# Patient Record
Sex: Female | Born: 1985 | Race: White | Hispanic: No | Marital: Married | Smoking: Former smoker
Health system: Southern US, Community
[De-identification: ages and names within clinical notes are randomized; demographics above are authoritative.]

## PROBLEM LIST (undated history)

## (undated) DIAGNOSIS — K589 Irritable bowel syndrome without diarrhea: Secondary | ICD-10-CM

## (undated) DIAGNOSIS — IMO0002 Reserved for concepts with insufficient information to code with codable children: Secondary | ICD-10-CM

## (undated) DIAGNOSIS — R87619 Unspecified abnormal cytological findings in specimens from cervix uteri: Secondary | ICD-10-CM

## (undated) DIAGNOSIS — F32A Depression, unspecified: Secondary | ICD-10-CM

## (undated) DIAGNOSIS — F329 Major depressive disorder, single episode, unspecified: Secondary | ICD-10-CM

## (undated) DIAGNOSIS — T7840XA Allergy, unspecified, initial encounter: Secondary | ICD-10-CM

## (undated) DIAGNOSIS — F419 Anxiety disorder, unspecified: Secondary | ICD-10-CM

## (undated) DIAGNOSIS — E162 Hypoglycemia, unspecified: Secondary | ICD-10-CM

## (undated) HISTORY — PX: INDUCED ABORTION: SHX677

## (undated) HISTORY — DX: Irritable bowel syndrome, unspecified: K58.9

## (undated) HISTORY — DX: Major depressive disorder, single episode, unspecified: F32.9

## (undated) HISTORY — DX: Allergy, unspecified, initial encounter: T78.40XA

## (undated) HISTORY — DX: Depression, unspecified: F32.A

---

## 2008-07-10 LAB — CONVERTED CEMR LAB

## 2010-05-01 HISTORY — PX: EYE SURGERY: SHX253

## 2010-05-19 ENCOUNTER — Ambulatory Visit
Admission: RE | Admit: 2010-05-19 | Discharge: 2010-05-19 | Payer: Self-pay | Source: Home / Self Care | Attending: Family Medicine | Admitting: Family Medicine

## 2010-05-19 ENCOUNTER — Other Ambulatory Visit: Payer: Self-pay | Admitting: Family Medicine

## 2010-05-19 ENCOUNTER — Encounter: Payer: Self-pay | Admitting: Family Medicine

## 2010-05-19 DIAGNOSIS — F329 Major depressive disorder, single episode, unspecified: Secondary | ICD-10-CM | POA: Insufficient documentation

## 2010-05-19 DIAGNOSIS — J45909 Unspecified asthma, uncomplicated: Secondary | ICD-10-CM | POA: Insufficient documentation

## 2010-05-19 DIAGNOSIS — J309 Allergic rhinitis, unspecified: Secondary | ICD-10-CM | POA: Insufficient documentation

## 2010-05-19 DIAGNOSIS — R5383 Other fatigue: Secondary | ICD-10-CM

## 2010-05-19 DIAGNOSIS — R109 Unspecified abdominal pain: Secondary | ICD-10-CM | POA: Insufficient documentation

## 2010-05-19 DIAGNOSIS — R5381 Other malaise: Secondary | ICD-10-CM | POA: Insufficient documentation

## 2010-05-19 LAB — CBC WITH DIFFERENTIAL/PLATELET
Basophils Absolute: 0 10*3/uL (ref 0.0–0.1)
HCT: 38.4 % (ref 36.0–46.0)
Lymphs Abs: 1.7 10*3/uL (ref 0.7–4.0)
MCV: 89.3 fl (ref 78.0–100.0)
Monocytes Absolute: 0.8 10*3/uL (ref 0.1–1.0)
Monocytes Relative: 10.8 % (ref 3.0–12.0)
Neutrophils Relative %: 64.4 % (ref 43.0–77.0)
Platelets: 288 10*3/uL (ref 150.0–400.0)
RDW: 12.4 % (ref 11.5–14.6)

## 2010-05-19 LAB — BASIC METABOLIC PANEL
BUN: 17 mg/dL (ref 6–23)
Creatinine, Ser: 0.7 mg/dL (ref 0.4–1.2)
GFR: 107.19 mL/min (ref >60.00)
Glucose, Bld: 89 mg/dL (ref 70–99)
Potassium: 4.4 mEq/L (ref 3.5–5.1)

## 2010-05-19 LAB — HEPATIC FUNCTION PANEL
Albumin: 3.5 g/dL (ref 3.5–5.2)
Total Bilirubin: 0.6 mg/dL (ref 0.3–1.2)

## 2010-05-19 LAB — LIPID PANEL
Cholesterol: 198 mg/dL (ref 0–200)
Triglycerides: 100 mg/dL (ref 0.0–149.0)
VLDL: 20 mg/dL (ref 0.0–40.0)

## 2010-05-19 LAB — TSH: TSH: 1.74 u[IU]/mL (ref 0.35–5.50)

## 2010-05-21 ENCOUNTER — Encounter
Admission: RE | Admit: 2010-05-21 | Discharge: 2010-05-21 | Payer: Self-pay | Source: Home / Self Care | Attending: Family Medicine | Admitting: Family Medicine

## 2010-05-28 NOTE — Assessment & Plan Note (Signed)
Summary: NEW PT TO EST/STOMACH/REFILL MEDS/   Vital Signs:  Patient profile:   25 year old female Height:      65 inches Weight:      137 pounds BMI:     22.88 Temp:     97.6 degrees F oral Pulse rate:   83 / minute Pulse rhythm:   regular BP sitting:   110 / 72  (left arm) Cuff size:   regular  Vitals Entered By: Linde Gillis CMA [facility] Dull) (May 19, 2010 10:39 AM) CC: new patient, establish care   History of Present Illness: 25 yo new to our practice with:  1. abdominal pain- on and off for several months.  thought it was a lactose intolerance because she would have bloating, generalized abdominal pain, nausea and diarrhea after eating cheese.  Lactaid has helped a little but symptoms still occuring several times per week and seem to be more severe in nature.  has had some episodes of vomiting and RUQ pain as well. no fevers or chills. no blood or mucous in stool. no fh of ibd.  2.  adjustment disorder with depressive mood- followed by Wendall Mola (psych).  On celexa 10 mg daily since her father died last year.  less tearful, more energy but still fatigued. less hypersomnia.   Current Medications (verified): 1)  Nuvaring 0.12-0.015 Mg/24hr Ring (Etonogestrel-Ethinyl Estradiol) .... Use As Directed 2)  Xyzal 5 Mg Tabs (Levocetirizine Dihydrochloride) .... Take One Tablet By Mouth At Bedtime 3)  Celexa 10 Mg Tabs (Citalopram Hydrobromide) .... Take One Tablet By Mouth At Bedtime 4)  Singulair 10 Mg Tabs (Montelukast Sodium) .... Take One Tablet By Mouth At Bedtime 5)  Nasonex 50 Mcg/act Susp (Mometasone Furoate) .... Use Daily 6)  Bepreve 1.5 % Soln (Bepotastine Besilate) .... Use Daily 7)  Xanax 0.25 Mg Tabs (Alprazolam) .... Take As Needed For Anxiety 8)  Bentyl 20 Mg Tabs (Dicyclomine Hcl) .Marland Kitchen.. 1 Tab By Mouth Four Times Daily As Needed For Abominal Discomfort 9)  One Touch Test Strp (Glucose Blood) .... Use As Directed. 10)  Onetouch Basic System W/device Kit (Blood Glucose  Monitoring Suppl) .... Use Ase Directed  Allergies (verified): No Known Drug Allergies  Past History:  Family History: Last updated: 05/19/2010 Family History of CAD Female 1st degree relative <50 Family History Thyroid disease Family History of Thromboembolism clotting disorder Family History of Stroke M 1st degree relative <50  Social History: Last updated: 05/19/2010 Married to Domingo Madeira 71 year old son medical case manager  Past Medical History: Depression Allergic rhinitis Asthma G1P1  Past Surgical History: lasik 05/01/10  Family History: Family History of CAD Female 1st degree relative <50 Family History Thyroid disease Family History of Thromboembolism clotting disorder Family History of Stroke M 1st degree relative <50  Social History: Married to [employer] 81 year old son medical case manager  Review of Systems      See HPI General:  Complains of fatigue; denies fever. Eyes:  Denies blurring. ENT:  Denies difficulty swallowing. CV:  Denies chest pain or discomfort. Resp:  Denies shortness of breath. GI:  Complains of abdominal pain, diarrhea, gas, indigestion, nausea, and vomiting; denies bloody stools. GU:  Denies discharge and dysuria. MS:  Denies joint pain, joint redness, and joint swelling. Derm:  Denies rash. Neuro:  Denies headaches. Psych:  Denies anxiety and depression. Endo:  Denies cold intolerance and heat intolerance. Heme:  Denies abnormal bruising and bleeding.  Physical Exam  General:  alert, well-developed,  well-nourished, and well-hydrated.   Head:  normocephalic and atraumatic.   Eyes:  vision grossly intact, pupils equal, pupils round, and pupils reactive to light.   Ears:  R ear normal and L ear normal.   Nose:  no external deformity.   Mouth:  fair dentition.   Neck:  No deformities, masses, or tenderness noted. Lungs:  Normal respiratory effort, chest expands symmetrically. Lungs are clear to auscultation, no  crackles or wheezes. Heart:  Normal rate and regular rhythm. S1 and S2 normal without gallop, murmur, click, rub or other extra sounds. Abdomen:  Bowel sounds positive,abdomen soft and non-tender without masses, organomegaly or hernias noted. Msk:  No deformity or scoliosis noted of thoracic or lumbar spine.   Extremities:  No clubbing, cyanosis, edema, or deformity noted with normal full range of motion of all joints.   Neurologic:  No cranial nerve deficits noted. Station and gait are normal. Plantar reflexes are down-going bilaterally. DTRs are symmetrical throughout. Sensory, motor and coordinative functions appear intact. Skin:  Intact without suspicious lesions or rashes Psych:  Cognition and judgment appear intact. Alert and cooperative with normal attention span and concentration. No apparent delusions, illusions, hallucinations   Impression & Recommendations:  Problem # 1:  ABDOMINAL PAIN OTHER SPECIFIED SITE (ICD-789.09) Assessment Deteriorated Likely multifactorial- ?IBS in addition to lactose intolerance. Will give rx for bentyl to see if it helps. U/S to rule out gall bladder pathology given progression of symptoms. Orders: Radiology Referral (Radiology) TLB-Hepatic/Liver Function Pnl (80076-HEPATIC)  Problem # 2:  FATIGUE (ICD-780.79) Assessment: Unchanged Likely related to depression and lifestyle changes but will check labs to rule out other contributing factors. Orders: Venipuncture (16109) TLB-Lipid Panel (80061-LIPID) TLB-BMP (Basic Metabolic Panel-BMET) (80048-METABOL) TLB-CBC Platelet - w/Differential (85025-CBCD) TLB-TSH (Thyroid Stimulating Hormone) (84443-TSH)  Problem # 3:  DEPRESSION (ICD-311) Assessment: Unchanged appears stable on current medications. Her updated medication list for this problem includes:    Celexa 10 Mg Tabs (Citalopram hydrobromide) .Marland Kitchen... Take one tablet by mouth at bedtime    Xanax 0.25 Mg Tabs (Alprazolam) .Marland Kitchen... Take as needed for  anxiety  Complete Medication List: 1)  Nuvaring 0.12-0.015 Mg/24hr Ring (Etonogestrel-ethinyl estradiol) .... Use as directed 2)  Xyzal 5 Mg Tabs (Levocetirizine dihydrochloride) .... Take one tablet by mouth at bedtime 3)  Celexa 10 Mg Tabs (Citalopram hydrobromide) .... Take one tablet by mouth at bedtime 4)  Singulair 10 Mg Tabs (Montelukast sodium) .... Take one tablet by mouth at bedtime 5)  Nasonex 50 Mcg/act Susp (Mometasone furoate) .... Use daily 6)  Bepreve 1.5 % Soln (Bepotastine besilate) .... Use daily 7)  Xanax 0.25 Mg Tabs (Alprazolam) .... Take as needed for anxiety 8)  Bentyl 20 Mg Tabs (Dicyclomine hcl) .Marland Kitchen.. 1 tab by mouth four times daily as needed for abominal discomfort 9)  One Touch Test Strp (Glucose blood) .... Use as directed. 10)  Onetouch Basic System W/device Kit (Blood glucose monitoring suppl) .... Use ase directed  Patient Instructions: 1)  Great to meet you. 2)  Please stop by to see Shirlee Limerick on your way out. Prescriptions: ONETOUCH BASIC SYSTEM W/DEVICE KIT (BLOOD GLUCOSE MONITORING SUPPL) Use ase directed  #1 x 0   Entered and Authorized by:   Ruthe Mannan MD   Signed by:   Ruthe Mannan MD on 05/19/2010   Method used:   Print then Give to Patient   RxID:   [id number] ONE TOUCH TEST STRP (GLUCOSE BLOOD) Use as directed.  #100 x 0   Entered and  Authorized by:   Ruthe Mannan MD   Signed by:   Ruthe Mannan MD on 05/19/2010   Method used:   Print then Give to Patient   RxID:   [id number] BENTYL 20 MG TABS (DICYCLOMINE HCL) 1 tab by mouth four times daily as needed for abominal discomfort  #60 x 0   Entered and Authorized by:   Ruthe Mannan MD   Signed by:   Ruthe Mannan MD on 05/19/2010   Method used:   Print then Give to Patient   RxID:   [id number]    Orders Added: 1)  Radiology Referral [Radiology] 2)  Venipuncture [84696] 3)  TLB-Lipid Panel [80061-LIPID] 4)  TLB-BMP (Basic Metabolic Panel-BMET) [80048-METABOL] 5)  TLB-CBC Platelet -  w/Differential [85025-CBCD] 6)  TLB-TSH (Thyroid Stimulating Hormone) [84443-TSH] 7)  TLB-Hepatic/Liver Function Pnl [80076-HEPATIC] 8)  New Patient Level IV [29528]    Current Allergies (reviewed today): No known allergies  PAP Result Date:  07/10/2008 PAP Result:  historical

## 2010-05-29 ENCOUNTER — Encounter: Payer: Self-pay | Admitting: Family Medicine

## 2010-06-03 NOTE — Miscellaneous (Signed)
Summary: Optometrist Release   Imported By: Kassie Mends 05/29/2010 08:29:37  _____________________________________________________________________  External Attachment:    Type:   Image     Comment:   External Document

## 2010-07-07 ENCOUNTER — Encounter (INDEPENDENT_AMBULATORY_CARE_PROVIDER_SITE_OTHER): Payer: Self-pay | Admitting: Family Medicine

## 2010-07-07 ENCOUNTER — Other Ambulatory Visit (HOSPITAL_COMMUNITY)
Admission: RE | Admit: 2010-07-07 | Discharge: 2010-07-07 | Disposition: A | Discharge status: Home / Self Care | Payer: BC Managed Care – PPO | Source: Ambulatory Visit | Attending: Family Medicine | Admitting: Family Medicine

## 2010-07-07 ENCOUNTER — Encounter: Payer: Self-pay | Admitting: Family Medicine

## 2010-07-07 ENCOUNTER — Other Ambulatory Visit: Payer: Self-pay | Admitting: Family Medicine

## 2010-07-07 DIAGNOSIS — K589 Irritable bowel syndrome without diarrhea: Secondary | ICD-10-CM

## 2010-07-07 DIAGNOSIS — Z0000 Encounter for general adult medical examination without abnormal findings: Secondary | ICD-10-CM

## 2010-07-07 DIAGNOSIS — Z1159 Encounter for screening for other viral diseases: Secondary | ICD-10-CM | POA: Insufficient documentation

## 2010-07-07 DIAGNOSIS — F329 Major depressive disorder, single episode, unspecified: Secondary | ICD-10-CM

## 2010-07-07 DIAGNOSIS — Z124 Encounter for screening for malignant neoplasm of cervix: Secondary | ICD-10-CM | POA: Insufficient documentation

## 2010-07-07 NOTE — Letter (Signed)
Summary: Records Dated 9-10 to 9-11/[facility]  Records Dated 9-10 to 9-11/[facility]   Imported By: Lanelle Bal 06/29/2010 12:50:32  _____________________________________________________________________  External Attachment:    Type:   Image     Comment:   External Document

## 2010-07-10 ENCOUNTER — Encounter: Payer: Self-pay | Admitting: Family Medicine

## 2010-07-14 NOTE — Letter (Signed)
Summary: Results Follow up Letter  [place] [facility]  [address] [place], [place] [address]   Phone: [phone]  Fax: [phone]    07/10/2010 MRN: [id number]  FOLASADE MOOTY [address] [facility] [place], [place]  [address]  [place]  Dear Ms. CALDWELL,  The following are the results of your recent test(s):  Test         Result    Pap Smear:        Normal __X___  Not Normal _____ Comments: ______________________________________________________ Cholesterol: LDL(Bad cholesterol):         Your goal is less than:         HDL (Good cholesterol):       Your goal is more than: Comments:  ______________________________________________________ Mammogram:        Normal _____  Not Normal _____ Comments:  ___________________________________________________________________ Hemoccult:        Normal _____  Not normal _______ Comments:    _____________________________________________________________________ Other Tests:    We routinely do not discuss normal results over the telephone.  If you desire a copy of the results, or you have any questions about this information we can discuss them at your next office visit.   Sincerely,      Dr. Ruthe Mannan

## 2010-07-14 NOTE — Assessment & Plan Note (Signed)
Summary: CPX PLUS PAP SMEAR / LFW   Vital Signs:  Patient profile:   25 year old female Height:      65 inches Weight:      152.50 pounds BMI:     25.47 Temp:     98.4 degrees F oral Pulse rate:   73 / minute Pulse rhythm:   regular BP sitting:   112 / 80  (left arm) Cuff size:   regular  Vitals Entered By: Linde Gillis CMA Duncan Dull) (July 07, 2010 9:01 AM) CC: complete physicial with pap   History of Present Illness: [place] yo here for CPX/Pap  1. abdominal pain- on and off for several months.  thought it was a lactose intolerance because she would have bloating, generalized abdominal pain, nausea and diarrhea after eating cheese.  Lactaid has helped a little but symptoms still occuring several times per week and seem to be more severe in nature.  has had some episodes of vomiting and RUQ pain as well. no fevers or chills. no blood or mucous in stool. no fh of ibd. Abdominal Ultrasound and labs were unremarkable.  Started Bentyl which does help a little bit.  2.  adjustment disorder with depressive mood- followed by Wendall Mola (psych).  On celexa 10 mg daily since her father died last year.  less tearful, more energy but still fatigued. less hypersomnia.  3.  Well woman- had a history of abnormal pap smears several years ago.  All have been normal since then.  Denies any vaginal discharge, dysuria or other symptoms.  Current Medications (verified): 1)  Nuvaring 0.12-0.015 Mg/24hr Ring (Etonogestrel-Ethinyl Estradiol) .... Use As Directed 2)  Xyzal 5 Mg Tabs (Levocetirizine Dihydrochloride) .... Take One Tablet By Mouth At Bedtime 3)  Singulair 10 Mg Tabs (Montelukast Sodium) .... Take One Tablet By Mouth At Bedtime 4)  Nasonex 50 Mcg/act Susp (Mometasone Furoate) .... Use Daily 5)  Bepreve 1.5 % Soln (Bepotastine Besilate) .... Use Daily 6)  Xanax 0.25 Mg Tabs (Alprazolam) .... Take As Needed For Anxiety 7)  Bentyl 20 Mg Tabs (Dicyclomine Hcl) .Marland Kitchen.. 1 Tab By Mouth Four Times Daily  As Needed For Abominal Discomfort 8)  One Touch Test Strp (Glucose Blood) .... Use As Directed. 9)  Onetouch Basic System W/device Kit (Blood Glucose Monitoring Suppl) .... Use Ase Directed 10)  Meloxicam 15 Mg Tabs (Meloxicam) .... Take One Tablet By Mouth Daily 11)  Paroxetine Hcl 10 Mg Tabs (Paroxetine Hcl) .... One By Mouth Daily  Allergies (verified): No Known Drug Allergies  Past History:  Past Medical History: Last updated: 05/19/2010 Depression Allergic rhinitis Asthma G1P1  Past Surgical History: Last updated: 05/19/2010 lasik 05/01/10  Family History: Last updated: 05/19/2010 Family History of CAD Female 1st degree relative <50 Family History Thyroid disease Family History of Thromboembolism clotting disorder Family History of Stroke M 1st degree relative <50  Social History: Last updated: 05/19/2010 Married to Domingo Madeira 56 year old son medical case manager  Review of Systems      See HPI General:  Denies fever. Eyes:  Denies blurring. ENT:  Denies difficulty swallowing. CV:  Denies chest pain or discomfort. Resp:  Denies shortness of breath. GI:  Denies bloody stools. GU:  Denies abnormal vaginal bleeding, discharge, and dysuria. MS:  Denies joint pain, joint redness, and joint swelling. Derm:  Denies rash. Neuro:  Denies headaches. Psych:  Denies anxiety and depression. Endo:  Denies cold intolerance and heat intolerance.  Physical Exam  General:  alert, well-developed,  well-nourished, and well-hydrated.   Head:  normocephalic and atraumatic.   Eyes:  vision grossly intact, pupils equal, pupils round, and pupils reactive to light.   Ears:  R ear normal and L ear normal.   Nose:  no external deformity.   Mouth:  fair dentition.   Neck:  No deformities, masses, or tenderness noted. Breasts:  No mass, nodules, thickening, tenderness, bulging, retraction, inflamation, nipple discharge or skin changes noted.   Lungs:  Normal respiratory effort,  chest expands symmetrically. Lungs are clear to auscultation, no crackles or wheezes. Heart:  Normal rate and regular rhythm. S1 and S2 normal without gallop, murmur, click, rub or other extra sounds. Abdomen:  Bowel sounds positive,abdomen soft and non-tender without masses, organomegaly or hernias noted. Rectal:  no external abnormalities.   Genitalia:  Pelvic Exam:        External: normal female genitalia without lesions or masses        Vagina: normal without lesions or masses        Cervix: normal without lesions or masses        Adnexa: normal bimanual exam without masses or fullness        Uterus: normal by palpation        Pap smear: performed Msk:  No deformity or scoliosis noted of thoracic or lumbar spine.   Extremities:  No clubbing, cyanosis, edema, or deformity noted with normal full range of motion of all joints.   Neurologic:  No cranial nerve deficits noted. Station and gait are normal. Plantar reflexes are down-going bilaterally. DTRs are symmetrical throughout. Sensory, motor and coordinative functions appear intact. Skin:  Intact without suspicious lesions or rashes Axillary Nodes:  No palpable lymphadenopathy Psych:  Cognition and judgment appear intact. Alert and cooperative with normal attention span and concentration. No apparent delusions, illusions, hallucinations   Impression & Recommendations:  Problem # 1:  Preventive Health Care (ICD-V70.0) Reviewed preventive care protocols, scheduled due services, and updated immunizations Discussed nutrition, exercise, diet, and healthy lifestyle.  Pap smear today.  Problem # 2:  DEPRESSION (ICD-311) Assessment: Unchanged will d/c celexa and start Paxil in hopes of helping with IBS symptoms. The following medications were removed from the medication list:    Celexa 10 Mg Tabs (Citalopram hydrobromide) .Marland Kitchen... Take one tablet by mouth at bedtime Her updated medication list for this problem includes:    Xanax 0.25 Mg Tabs  (Alprazolam) .Marland Kitchen... Take as needed for anxiety    Paroxetine Hcl 10 Mg Tabs (Paroxetine hcl) ..... One by mouth daily  Problem # 3:  ? of IBS (ICD-564.1) Assessment: Improved slightly improved with Bentyl. will d/c celexa and start and SSRI to hopefully help with symptoms.  if no improvement, referral to GI for further work up- no red flag symptoms of IBD, colitis, etc.  Complete Medication List: 1)  Nuvaring 0.12-0.015 Mg/24hr Ring (Etonogestrel-ethinyl estradiol) .... Use as directed 2)  Xyzal 5 Mg Tabs (Levocetirizine dihydrochloride) .... Take one tablet by mouth at bedtime 3)  Singulair 10 Mg Tabs (Montelukast sodium) .... Take one tablet by mouth at bedtime 4)  Nasonex 50 Mcg/act Susp (Mometasone furoate) .... Use daily 5)  Bepreve 1.5 % Soln (Bepotastine besilate) .... Use daily 6)  Xanax 0.25 Mg Tabs (Alprazolam) .... Take as needed for anxiety 7)  Bentyl 20 Mg Tabs (Dicyclomine hcl) .Marland Kitchen.. 1 tab by mouth four times daily as needed for abominal discomfort 8)  One Touch Test Strp (Glucose blood) .... Use as directed. 9)  Onetouch Basic System W/device Kit (Blood glucose monitoring suppl) .... Use ase directed 10)  Meloxicam 15 Mg Tabs (Meloxicam) .... Take one tablet by mouth daily 11)  Paroxetine Hcl 10 Mg Tabs (Paroxetine hcl) .... One by mouth daily  Patient Instructions: 1)  Take 1 tablet of Celexa every other day for 1 week and stop. 2)  Go ahead and start taking your Paxil every day. 3)  Follow up in one month. Prescriptions: PAROXETINE HCL 10 MG TABS (PAROXETINE HCL) one by mouth daily  #30 x 1   Entered and Authorized by:   Ruthe Mannan MD   Signed by:   Ruthe Mannan MD on 07/07/2010   Method used:   Print then Give to Patient   RxID:   [id number]    Orders Added: 1)  Est. Patient 18-39 years [99395] 2)  Est. Patient Level III [14782]    Current Allergies (reviewed today): No known allergies

## 2010-09-04 ENCOUNTER — Other Ambulatory Visit: Payer: Self-pay | Admitting: Family Medicine

## 2010-09-18 ENCOUNTER — Ambulatory Visit (INDEPENDENT_AMBULATORY_CARE_PROVIDER_SITE_OTHER): Payer: BC Managed Care – PPO | Admitting: Family Medicine

## 2010-09-18 ENCOUNTER — Encounter: Payer: Self-pay | Admitting: Family Medicine

## 2010-09-18 DIAGNOSIS — F329 Major depressive disorder, single episode, unspecified: Secondary | ICD-10-CM

## 2010-09-18 DIAGNOSIS — F3289 Other specified depressive episodes: Secondary | ICD-10-CM

## 2010-09-18 DIAGNOSIS — H9209 Otalgia, unspecified ear: Secondary | ICD-10-CM

## 2010-09-18 MED ORDER — SERTRALINE HCL 25 MG PO TABS: 25.0000 mg | ORAL_TABLET | Freq: Every day | ORAL | Status: DC

## 2010-09-18 NOTE — Progress Notes (Deleted)
Subjective:     Sydney Walters is a 25 y.o. female who presents with ear pain and possible ear infection. Symptoms include: {otitis symptoms:327}. Onset of symptoms was {1-10:13787} {units:18646::"days"} ago, and have been {clinical course - history:17::"unchanged"} since that time. Associated symptoms include: {assoc symptoms:315322}.  Patient denies: {symptoms:19546}. She {hydration history:15378}.  {Common ambulatory SmartLinks:19316}  Review of Systems {ros; complete:30496}   Objective:    There were no vitals taken for this visit. General:  {exam; general:16600}  Right Ear: {Findings; exam right ZOX:09604}  Left Ear: {Findings; exam left VWU:98119}  Mouth:  {oropharynx exam:17160::"lips, mucosa, and tongue normal; teeth and gums normal"}  Neck: {neck exam:17463::"no adenopathy","no carotid bruit","no JVD","supple, symmetrical, trachea midline","thyroid not enlarged, symmetric, no tenderness/mass/nodules"}     Assessment:    {right/left/bi:19544} acute otitis media   Plan:    Treatment: {sinusitis antibiotics:15376}. OTC analgesia as needed. Fluids, rest, avoid carbonated/alcoholic and caffeinated beverages.  Follow up in {1-10:13787} {time; units:18646} if not improving.

## 2010-09-18 NOTE — Progress Notes (Signed)
SUBJECTIVE: Asta Corbridge is a 25 y.o. female with  1.  Ear pain-  7 day(s) history of pain left ear and jaw pain.  No URI symptoms.  Afebrile.    2.  Depression/anxiety- stopped taking her paxil. Felt like it wasn't working. More tearful and anxious now.   No panic attacks. No SI or HI.  OBJECTIVE: BP 120/80  Pulse 96  Temp(Src) 98.3 F (36.8 C) (Oral)  Ht 5\' 5"  (1.651 m)  Wt 160 lb 12.8 oz (72.938 kg)  BMI 26.76 kg/m2  LMP 09/04/2010 General appearance: alert, well appearing, and in no distress.   Ears: bilateral TM's and external ear canals normal Nose: normal and patent, no erythema, discharge or polyps Oropharynx: mucous membranes moist, pharynx normal without lesions Neck: supple, no significant adenopathy Lungs: clear to auscultation, no wheezes, rales or rhonchi, symmetric air entry Psych- tearful but good eye contact.  ASSESSMENT: 1. Otalgia- ?possible TMJ.  Pt will discuss with her dentist and ENT when she sees them next month. No signs of infection.  2.  Depression- deteriorated. Will try Zoloft.  Pt to follow up in one month.  PLAN: 1) See orders for this visit as documented in the electronic medical record. 2) Symptomatic therapy suggested: use acetaminophen prn.  3) Call or return to clinic prn if these symptoms worsen or fail to improve as anticipated.

## 2010-11-02 ENCOUNTER — Other Ambulatory Visit: Payer: Self-pay | Admitting: *Deleted

## 2010-11-02 MED ORDER — DICYCLOMINE HCL 20 MG PO TABS: 20.0000 mg | ORAL_TABLET | Freq: Four times a day (QID) | ORAL | Status: DC

## 2010-11-02 NOTE — Telephone Encounter (Signed)
This medication is not on patient's medication list, please advise  

## 2011-03-26 ENCOUNTER — Encounter (HOSPITAL_COMMUNITY): Payer: Self-pay | Admitting: *Deleted

## 2011-03-26 ENCOUNTER — Inpatient Hospital Stay (HOSPITAL_COMMUNITY)
Admission: AD | Admit: 2011-03-26 | Discharge: 2011-03-26 | Disposition: A | Discharge status: Home / Self Care | Payer: Self-pay | Source: Ambulatory Visit | Attending: Family Medicine | Admitting: Family Medicine

## 2011-03-26 ENCOUNTER — Inpatient Hospital Stay (HOSPITAL_COMMUNITY): Payer: Self-pay

## 2011-03-26 DIAGNOSIS — O021 Missed abortion: Secondary | ICD-10-CM | POA: Insufficient documentation

## 2011-03-26 HISTORY — DX: Anxiety disorder, unspecified: F41.9

## 2011-03-26 HISTORY — DX: Reserved for concepts with insufficient information to code with codable children: IMO0002

## 2011-03-26 HISTORY — DX: Unspecified abnormal cytological findings in specimens from cervix uteri: R87.619

## 2011-03-26 HISTORY — DX: Hypoglycemia, unspecified: E16.2

## 2011-03-26 LAB — CBC
HCT: 36.9 % (ref 36.0–46.0)
MCHC: 32.8 g/dL (ref 30.0–36.0)
MCV: 86.4 fL (ref 78.0–100.0)
Platelets: 261 10*3/uL (ref 150–400)
RDW: 12.4 % (ref 11.5–15.5)

## 2011-03-26 LAB — URINALYSIS, ROUTINE W REFLEX MICROSCOPIC
Glucose, UA: NEGATIVE mg/dL
Specific Gravity, Urine: >1.03 — ABNORMAL HIGH (ref 1.005–1.030)
Urobilinogen, UA: 0.2 mg/dL (ref 0.0–1.0)

## 2011-03-26 LAB — URINE MICROSCOPIC-ADD ON

## 2011-03-26 LAB — POCT PREGNANCY, URINE: Preg Test, Ur: POSITIVE

## 2011-03-26 MED ORDER — OXYCODONE-ACETAMINOPHEN 5-325 MG PO TABS: 1.0000 | ORAL_TABLET | ORAL | Status: AC | PRN

## 2011-03-26 MED ORDER — IBUPROFEN 600 MG PO TABS: 600.0000 mg | ORAL_TABLET | Freq: Four times a day (QID) | ORAL | Status: AC | PRN

## 2011-03-26 NOTE — ED Notes (Signed)
No bleeding after [place].  Pinkish d/c only, no clots since arrival

## 2011-03-26 NOTE — ED Provider Notes (Signed)
Sydney Caldwell25 y.o.G3P1011 @[redacted]w[redacted]d  by sure LMP Chief Complaint  Patient presents with  . Vaginal Bleeding    SUBJECTIVE  HPI: Presents with a scant amount of brown to pink spotting and suprapubic cramping for the past few days. Denies irritative vaginitis symptoms or urinary symptoms. Checks CBGs due to hypoglycemia.   Past Medical History  Diagnosis Date  . Allergy   . Asthma   . Anxiety   . Depression     hx of med usuage, doing ok currently  . Abnormal Pap smear     bx and follow up were normal   Past Surgical History  Procedure Date  . Induced abortion   . Eye surgery 05/01/2010    lasik    History   Social History  . Marital Status: Married    Spouse Name: N/A    Number of Children: 1  . 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 Smoking status: Former Smoker    Quit date: 06/24/2008  . Smokeless tobacco: Never Used  . Alcohol Use: No  . Drug Use: No  . Sexually Active: Not Currently   Other Topics Concern  . Not on file   Social History Narrative   Married to [employer]   No current facility-administered medications on file prior to encounter.   Current Outpatient Prescriptions on File Prior to Encounter  Medication Sig Dispense Refill  . Blood Glucose Monitoring Suppl (BLOOD GLUCOSE MONITOR KIT) KIT (One Touch basic system w/device kit) use as directed       . glucose blood test strip 1 each. (One Touch test strips) use as directed        Allergies  Allergen Reactions  . Mushroom Extract Complex Anaphylaxis    ROS: Pertinent items in HPI  OBJECTIVE  BP 117/66  Pulse 75  Temp(Src) 98.6 F (37 C) (Oral)  Resp 18  LMP 01/20/2011 Physical Exam  Constitutional: She is well-developed, well-nourished, and in no distress. No distress.  HENT:  Head: Normocephalic.  Neck: Neck supple.  Cardiovascular: Normal rate.   Pulmonary/Chest: Effort normal.  Abdominal: Soft. There is  no tenderness.  Genitourinary:       Pt crying and distressed about loss so declined pelvic     Results for orders placed during the hospital encounter of 03/26/11 (from the past 24 hour(s))  URINALYSIS, ROUTINE W REFLEX MICROSCOPIC     Status: Abnormal   Collection Time   03/26/11 10:10 AM      Component Value Range   Color, Urine YELLOW  YELLOW    APPearance CLEAR  CLEAR    Specific Gravity, Urine >1.030 (*) 1.005 - 1.030    pH 5.5  5.0 - 8.0    Glucose, UA NEGATIVE  NEGATIVE (mg/dL)   Hgb urine dipstick MODERATE (*) NEGATIVE    Bilirubin Urine NEGATIVE  NEGATIVE    Ketones, ur NEGATIVE  NEGATIVE (mg/dL)   Protein, ur NEGATIVE  NEGATIVE (mg/dL)   Urobilinogen, UA 0.2  0.0 - 1.0 (mg/dL)   Nitrite NEGATIVE  NEGATIVE    Leukocytes, UA SMALL (*) NEGATIVE   URINE MICROSCOPIC-ADD ON     Status: Abnormal   Collection Time   03/26/11 10:10 AM      Component Value Range   Squamous Epithelial / LPF FEW (*) RARE    WBC, UA 3-6  <3 (WBC/hpf)   RBC / HPF 3-6  <3 (RBC/hpf)  Urine-Other MUCOUS PRESENT    POCT PREGNANCY, URINE     Status: Normal   Collection Time   03/26/11 10:19 AM      Component Value Range   Preg Test, Ur POSITIVE    CBC     Status: Normal   Collection Time   03/26/11 12:15 PM      Component Value Range   WBC 8.9  4.0 - 10.5 (K/uL)   RBC 4.27  3.87 - 5.11 (MIL/uL)   Hemoglobin 12.1  12.0 - 15.0 (g/dL)   HCT 96.0  45.4 - 09.8 (%)   MCV 86.4  78.0 - 100.0 (fL)   MCH 28.3  26.0 - 34.0 (pg)   MCHC 32.8  30.0 - 36.0 (g/dL)   RDW 11.9  14.7 - 82.9 (%)   Platelets 261  150 - 400 (K/uL)  ABO/RH     Status: Normal   Collection Time   03/26/11 12:15 PM      Component Value Range   ABO/RH(D) O POS    HCG, QUANTITATIVE, PREGNANCY     Status: Abnormal   Collection Time   03/26/11 12:15 PM      Component Value Range   hCG, Beta Chain, Quant, S 94935 (*) <5 (mIU/mL)   US Ob Comp Less 14 Wks: [redacted]w[redacted]d by CRL with no cardaic activity, small SCH   ASSESSMENT G3P1011 at  [redacted]w[redacted]d LMP with failed IUP  PLAN Will check GC/CT on urine Counseled at length and offered her the options of expectant management or cytotec. She elects to do expectant management and knows she can change her mind and do Cytotec at any time. Followup appointment 2 weeks at GYN clinic. Rx ibuprofen and Percocet if needed for pain.

## 2011-03-26 NOTE — Progress Notes (Signed)
Missed period, +HPT on 11/01. Made appt (12/06) at health dept for confirmation.  Started spotting on Tues. On Thurs- noted more blood (squirt of blood) when had a BM.  Spotting has continued.

## 2011-03-26 NOTE — ED Notes (Signed)
Spoke with CNM, regarding plan. No orders in.  CNM had not been in to see pt yet- discussed complaint.(+preg w/ pain and bleeding).

## 2011-03-26 NOTE — ED Provider Notes (Signed)
Chart reviewed and agree with management and plan.  

## 2011-04-21 ENCOUNTER — Encounter: Payer: Self-pay | Admitting: Obstetrics and Gynecology

## 2011-04-21 ENCOUNTER — Ambulatory Visit (INDEPENDENT_AMBULATORY_CARE_PROVIDER_SITE_OTHER): Payer: Self-pay | Admitting: Obstetrics and Gynecology

## 2011-04-21 VITALS — BP 126/87 | HR 130 | Temp 97.7°F | Ht 65.5 in | Wt 157.1 lb

## 2011-04-21 DIAGNOSIS — O021 Missed abortion: Secondary | ICD-10-CM

## 2011-04-21 MED ORDER — IBUPROFEN 600 MG PO TABS: 600.0000 mg | ORAL_TABLET | Freq: Four times a day (QID) | ORAL | Status: AC | PRN

## 2011-04-21 MED ORDER — MISOPROSTOL 200 MCG PO TABS: 200.0000 ug | ORAL_TABLET | Freq: Four times a day (QID) | ORAL | Status: DC

## 2011-04-21 NOTE — Progress Notes (Signed)
States after MAU visit did not bleed any until has spotted 2 days ago

## 2011-04-21 NOTE — Patient Instructions (Signed)
Incomplete Miscarriage Miscarriages in pregnancy are common. A miscarriage is a pregnancy that has ended before the twentieth week. You have had an incomplete miscarriage. Partial parts of the fetus or placenta (afterbirth) remain behind. Sometimes further treatment is needed. The most common reason for further treatment is continued bleeding (hemorrhage). Tissue left behind may also become infected. Treatment usually is curettage. Curettage for an incomplete abortion is a procedure in which the remaining products of pregnancy are removed. This can be done by a simple sucking procedure (suction curettage). It can also be done by a simple scraping (curettage) of the inside of the uterus (womb). This may be done in the hospital or in the caregiver's office. This is only done when your caregiver knows the pregnancy has ended. This is determined by physical examination and a negative pregnancy test. It may also include an ultrasound to confirm a dead fetus. The ultrasound may also prove that products of the pregnancy remain in the uterus. If your cervix remains dilated and you are still passing clots and tissue, your caregiver may wish to watch you for a little while. Your caregiver may want to see if you are going to finish passing all of the remaining parts of the pregnancy. If the bleeding continues, they may proceed with curettage. WHY DO I FEEL THIS WAY Miscarriages can be a very emotional time for prospective mothers. This is not you or your partner's fault. The miscarriage did not occur because of a lack in you or your partner. Nearly all miscarriages occur because the pregnancy has started off wrongly. At least half of miscarried pregnancies have a chromosomal abnormality (almost always not inherited). Others may have developmental problems with the fetus or placentas. Problems may not show up even when the products miscarried are studied under the microscope. You can usually begin trying for another  pregnancy as soon as your caregiver says it's okay. HOME CARE INSTRUCTIONS   Your caregiver may order bed rest (this means only getting up to use the bathroom). Your caregiver may allow you to continue light activity. If curettage was not done at this time, but you require further treatment.   Keep track of the number of pads you use each day. Keep track of how saturated (soaked) they are. Record this information.   Do not use tampons. Do not douche or have sexual intercourse until approved by your caregiver.   It is very important to keep all follow-up appointments for re-evaluation and continuing management.   Women who have an Rh negative blood type (ie, A, B, AB, or O negative) need to receive a drug called Rh(D) immune globulin. This medicine helps protect future fetuses against problems that can occur if an Rh negative mother is carrying a baby who is Rh positive.  SEEK IMMEDIATE MEDICAL CARE IF:   You experience severe cramps in your stomach, back, or abdomen.   You run an unexplained temperature (record these).   You pass large clots or tissue (save any tissue for your caregiver to inspect).   Your bleeding increases or you become light-headed, weak, or have fainting episodes.  MAKE SURE YOU:   Understand these instructions.   Will watch your condition.   Will get help right away if you are not doing well or get worse.  Document Released: 04/12/2005 Document Revised: 12/23/2010 Document Reviewed: 12/01/2007 [employer] Patient Information 2012 [employer]. 

## 2011-04-23 NOTE — Progress Notes (Signed)
Sydney Caldwell25 y.o.G3P1011 @[redacted]w[redacted]d  LMP Chief Complaint  Patient presents with  . Follow-up    SAB-03/26/11    SUBJECTIVE  HPI: Seen by me in MAU 03/26/11 for EPB: blood type O+, quant 95,000, hgb 12.1. [place]: IUP with embyro [redacted]w[redacted]d by CRL, no cardiac activity, small SCH. She declined cytotec and chose expectant management. Since then she has had no pain or cramping until 2 days ago began spotting and having mild cramps. She comes with her partner who would like another [place].  Past Medical History  Diagnosis Date  . Asthma   . Depression     hx of med usuage, doing ok currently  . Abnormal Pap smear     bx and follow up were normal  . Hypoglycemia   . Allergy     seasonal  . Anxiety    Past Surgical History  Procedure Date  . Induced abortion   . Eye surgery 05/01/2010    lasik    History   Social History  . Marital Status: Married    Spouse Name: N/A    Number of Children: 1  . 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 Smoking status: Former Smoker    Quit date: 06/24/2008  . Smokeless tobacco: Never Used  . Alcohol Use: No  . Drug Use: No  . Sexually Active: Not Currently    Birth Control/ Protection: None   Other Topics Concern  . Not on file   Social History Narrative   Married to Sydney Walters   Current Outpatient Prescriptions on File Prior to Visit  Medication Sig Dispense Refill  . Blood Glucose Monitoring Suppl (BLOOD GLUCOSE MONITOR KIT) KIT (One Touch basic system w/device kit) use as directed       . folic acid (FOLVITE) 400 MCG tablet Take 400 mcg by mouth daily.        Marland Kitchen glucose blood test strip 1 each. (One Touch test strips) use as directed       . Multiple Vitamins-Minerals (MULTIVITAMINS THER. W/MINERALS) TABS Take 1 tablet by mouth daily.        Bertram Gala Glycol-Propyl Glycol (SYSTANE) 0.4-0.3 % SOLN Apply 1 drop to eye daily as needed. Dryness        Allergies  Allergen  Reactions  . Mushroom Extract Complex Anaphylaxis    ROS: Pertinent items in HPI  OBJECTIVE  BP 126/87  Pulse 130  Temp(Src) 97.7 F (36.5 C) (Oral)  Ht 5' 5.5" (1.664 m)  Wt 157 lb 1.6 oz (71.26 kg)  BMI 25.75 kg/m2  LMP 01/20/2011  Breastfeeding? Unknown  Pelvic: scant dark blood, cx L/C, uterus 6 wk size Abd [place] by Diane: early IUP, small GS with ? embryo, no cardiac activity  ASSESSMENT Confirmed embyonic demise for couple and offerred cytotec    PLAN RX cytotec 800 mcg pv at hs and ibuprofen 600 mg q6h. Counseled to expect bleeding heavier than a period and advised to call for F/U in 6 wks after event.

## 2011-07-01 ENCOUNTER — Telehealth: Payer: Self-pay | Admitting: *Deleted

## 2011-07-01 ENCOUNTER — Encounter (HOSPITAL_COMMUNITY): Payer: Self-pay | Admitting: *Deleted

## 2011-07-01 ENCOUNTER — Inpatient Hospital Stay (HOSPITAL_COMMUNITY): Payer: 59

## 2011-07-01 ENCOUNTER — Inpatient Hospital Stay (HOSPITAL_COMMUNITY)
Admission: AD | Admit: 2011-07-01 | Discharge: 2011-07-01 | Disposition: A | Discharge status: Home / Self Care | Payer: 59 | Source: Ambulatory Visit | Attending: Obstetrics & Gynecology | Admitting: Obstetrics & Gynecology

## 2011-07-01 DIAGNOSIS — N926 Irregular menstruation, unspecified: Secondary | ICD-10-CM

## 2011-07-01 DIAGNOSIS — N949 Unspecified condition associated with female genital organs and menstrual cycle: Secondary | ICD-10-CM | POA: Insufficient documentation

## 2011-07-01 DIAGNOSIS — N938 Other specified abnormal uterine and vaginal bleeding: Secondary | ICD-10-CM | POA: Insufficient documentation

## 2011-07-01 DIAGNOSIS — N939 Abnormal uterine and vaginal bleeding, unspecified: Secondary | ICD-10-CM

## 2011-07-01 LAB — URINE MICROSCOPIC-ADD ON

## 2011-07-01 LAB — CBC
MCH: 28.7 pg (ref 26.0–34.0)
Platelets: 244 10*3/uL (ref 150–400)
RBC: 4.77 MIL/uL (ref 3.87–5.11)
WBC: 8.7 10*3/uL (ref 4.0–10.5)

## 2011-07-01 LAB — WET PREP, GENITAL: Trich, Wet Prep: NONE SEEN

## 2011-07-01 LAB — URINALYSIS, ROUTINE W REFLEX MICROSCOPIC
Bilirubin Urine: NEGATIVE
Glucose, UA: NEGATIVE mg/dL
Ketones, ur: NEGATIVE mg/dL
Protein, ur: NEGATIVE mg/dL
pH: 6 (ref 5.0–8.0)

## 2011-07-01 MED ORDER — AZITHROMYCIN 1 G PO PACK
1.0000 g | PACK | Freq: Once | ORAL | Status: AC
  Administered 2011-07-01: 1 g via ORAL
  Filled 2011-07-01: qty 1

## 2011-07-01 MED ORDER — NORGESTREL-ETHINYL ESTRADIOL 0.3-30 MG-MCG PO TABS: 1.0000 | ORAL_TABLET | Freq: Every day | ORAL | Status: DC

## 2011-07-01 MED ORDER — FLUCONAZOLE 150 MG PO TABS: 150.0000 mg | ORAL_TABLET | Freq: Once | ORAL | Status: AC

## 2011-07-01 MED ORDER — CEFTRIAXONE SODIUM 250 MG IJ SOLR
250.0000 mg | Freq: Once | INTRAMUSCULAR | Status: AC
  Administered 2011-07-01: 250 mg via INTRAMUSCULAR
  Filled 2011-07-01: qty 250

## 2011-07-01 NOTE — Progress Notes (Deleted)
Pt states she first noticed her headache for the past 2 days that have not gone away. Pt has been coughing since Monday night .

## 2011-07-01 NOTE — Discharge Instructions (Signed)
Abnormal Uterine Bleeding Abnormal uterine bleeding can have many causes. Some cases are simply treated, while others are more serious. There are several kinds of bleeding that is considered abnormal, including:  Bleeding between periods.   Bleeding after sexual intercourse.   Spotting anytime in the menstrual cycle.   Bleeding heavier or more than normal.   Bleeding after menopause.  CAUSES  There are many causes of abnormal uterine bleeding. It can be present in teenagers, pregnant women, women during their reproductive years, and women who have reached menopause. Your caregiver will look for the more common causes depending on your age, signs, symptoms and your particular circumstance. Most cases are not serious and can be treated. Even the more serious causes, like cancer of the female organs, can be treated adequately if found in the early stages. That is why all types of bleeding should be evaluated and treated as soon as possible. DIAGNOSIS  Diagnosing the cause may take several kinds of tests. Your caregiver may:  Take a complete history of the type of bleeding.   Perform a complete physical exam and Pap smear.   Take an ultrasound on the abdomen showing a picture of the female organs and the pelvis.   Inject dye into the uterus and Fallopian tubes and X-ray them (hysterosalpingogram).   Place fluid in the uterus and do an ultrasound (sonohysterogrqphy).   Take a CT scan to examine the female organs and pelvis.   Take an MRI to examine the female organs and pelvis. There is no X-ray involved with this procedure.   Look inside the uterus with a telescope that has a light at the end (hysteroscopy).   Scrap the inside of the uterus to get tissue to examine (Dilatation and Curettage, D&C).   Look into the pelvis with a telescope that has a light at the end (laparoscopy). This is done through a very small cut (incision) in the abdomen.  TREATMENT  Treatment will depend on the  cause of the abnormal bleeding. It can include:  Doing nothing to allow the problem to take care of itself over time.   Hormone treatment.   Birth control pills.   Treating the medical condition causing the problem.   Laparoscopy.   Major or minor surgery   Destroying the lining of the uterus with electrical currant, laser, freezing or heat (uterine ablation).  HOME CARE INSTRUCTIONS   Follow your caregiver's recommendation on how to treat your problem.   See your caregiver if you missed a menstrual period and think you may be pregnant.   If you are bleeding heavily, count the number of pads/tampons you use and how often you have to change them. Tell this to your caregiver.   Avoid sexual intercourse until the problem is controlled.  SEEK MEDICAL CARE IF:   You have any kind of abnormal bleeding mentioned above.   You feel dizzy at times.   You are 26 years old and have not had a menstrual period yet.  SEEK IMMEDIATE MEDICAL CARE IF:   You pass out.   You are changing pads/tampons every 15 to 30 minutes.   You have belly (abdominal) pain.   You have a temperature of 100 F (37.8 C) or higher.   You become sweaty or weak.   You are passing large blood clots from the vagina.   You start to feel sick to your stomach (nauseous) and throw up (vomit).  Document Released: 04/12/2005 Document Revised: 04/01/2011 Document Reviewed: 09/05/2008 [employer]   Patient Information 2012 [employer]. 

## 2011-07-01 NOTE — Telephone Encounter (Signed)
Called patient states had miscarriage in November, but never successfully evacuated everything and went to [facility]U and got cytotec 04/26/11 or/04/27/11, then had period end January, states never had follow up after cytotec,  but now bleeding x 12 days and painful and states is not usual  For her periods. Instructed patient to go to MAU asap for follow up.today or tomorrow at latest.  Patient voices understanding

## 2011-07-01 NOTE — Progress Notes (Signed)
Patient states she had an incomplete AB and was given Cytotec the end of December. Had a regular period on 1-24 and started again on 2-25 and has had bleeding every day since then. Blood is bright red and has a bad odor. Started having lower abdominal cramping on 3-1. Has some nausea, but no vomiting.

## 2011-07-01 NOTE — Telephone Encounter (Signed)
Pt called stating went to ER for SAB. Had her follow up and took cytotec. . Missed her next follow up appointment. Pt states is now bleeding for 12 days and having a lot of pain and bleeding is heavy. Would like call back.

## 2011-07-01 NOTE — ED Provider Notes (Signed)
History     CSN: [id number]  Arrival date & time 07/01/11  1640   None     Chief Complaint  Patient presents with  . Vaginal Bleeding    HPI Sydney Walters is a 26 y.o. female who presents to [facility] for vaginal bleeding.History of SAB in December and did not go for follow up as instructed and wanted to be sure not sill having bleeding associated with the pregnancy. Onset of bleeding 06/21/11 started bleeding like a normal period got lighter and then spotting but then started getting heavier 06/25/11 and has continue with bright red blood. Cramping lower abdominal pain. The history was provided by the patient and her past medical record.  Past Medical History  Diagnosis Date  . Asthma   . Depression     hx of med usuage, doing ok currently  . Abnormal Pap smear     bx and follow up were normal  . Hypoglycemia   . Allergy     seasonal  . Anxiety     Past Surgical History  Procedure Date  . Induced abortion   . Eye surgery 05/01/2010    lasik     Family History  Problem Relation Age of Onset  . Coronary artery disease Other   . Thyroid disease Other   . Stroke Other   . Anesthesia problems Neg Hx   . Depression Mother   . Carpal tunnel syndrome Mother   . Anxiety disorder Mother   . Other      hypoglycemia  . Graves' disease Father   . Hypertension Father   . Mitral valve prolapse Father   . Heart disease Father     Congestive heart disease  . Diabetes Father     History  Substance Use Topics  . Smoking status: Former Smoker    Quit date: 06/24/2008  . Smokeless tobacco: Never Used  . Alcohol Use: No    OB History    Grav Para Term Preterm Abortions TAB SAB Ect Mult Living   3 1 1  0 2 1 1  0 0 1      Review of Systems  Constitutional: Positive for chills. Negative for fever, diaphoresis and fatigue.  HENT: Negative for ear pain, congestion, sore throat, facial swelling, neck pain, neck stiffness, dental problem and sinus pressure.   Eyes: Negative for  photophobia, pain and discharge.  Respiratory: Negative for cough, chest tightness and wheezing.   Cardiovascular: Negative.   Gastrointestinal: Positive for nausea and abdominal pain. Negative for vomiting, diarrhea, constipation and abdominal distention.  Genitourinary: Positive for frequency, vaginal bleeding, vaginal discharge and pelvic pain. Negative for dysuria, urgency, flank pain and difficulty urinating.  Musculoskeletal: Positive for back pain. Negative for myalgias and gait problem.  Skin: Negative for color change and rash.  Neurological: Positive for headaches. Negative for dizziness, speech difficulty, weakness, light-headedness and numbness.  Psychiatric/Behavioral: Negative for confusion and agitation. The patient is nervous/anxious.        Depression    Allergies  Mushroom extract complex  Home Medications  No current outpatient prescriptions on file.  BP 111/76  Pulse 111  Temp(Src) 98.9 F (37.2 C) (Oral)  Resp 16  Ht 5\' 5"  (1.651 m)  Wt 154 lb 12.8 oz (70.217 kg)  BMI 25.76 kg/m2  SpO2 100%  LMP 06/21/2011  Physical Exam  Nursing note and vitals reviewed. Constitutional: She is oriented to person, place, and time. She appears well-developed and well-nourished.  HENT:  Head: Normocephalic.  Eyes:  EOM are normal.  Neck: Neck supple.  Cardiovascular:       Tachycardia   Pulmonary/Chest: Effort normal.  Abdominal: Soft. There is tenderness in the right lower quadrant and left lower quadrant. There is no rigidity, no rebound, no guarding and no CVA tenderness.  Genitourinary:       External genitalia without lesions. Moderate blood vaginal vault. Positive CMT, bilateral adnexal tenderness. Uterus without palpable enlargement.  Musculoskeletal: Normal range of motion.  Neurological: She is alert and oriented to person, place, and time. No cranial nerve deficit.  Skin: Skin is warm and dry.  Psychiatric: She has a normal mood and affect. Her behavior is  normal. Judgment and thought content normal.    ED Course  Procedures Results for orders placed during the hospital encounter of 07/01/11 (from the past 24 hour(s))  URINALYSIS, ROUTINE W REFLEX MICROSCOPIC     Status: Abnormal   Collection Time   07/01/11  7:18 PM      Component Value Range   Color, Urine YELLOW  YELLOW    APPearance HAZY (*) CLEAR    Specific Gravity, Urine 1.025  1.005 - 1.030    pH 6.0  5.0 - 8.0    Glucose, UA NEGATIVE  NEGATIVE (mg/dL)   Hgb urine dipstick LARGE (*) NEGATIVE    Bilirubin Urine NEGATIVE  NEGATIVE    Ketones, ur NEGATIVE  NEGATIVE (mg/dL)   Protein, ur NEGATIVE  NEGATIVE (mg/dL)   Urobilinogen, UA 0.2  0.0 - 1.0 (mg/dL)   Nitrite NEGATIVE  NEGATIVE    Leukocytes, UA NEGATIVE  NEGATIVE   URINE MICROSCOPIC-ADD ON     Status: Abnormal   Collection Time   07/01/11  7:18 PM      Component Value Range   Squamous Epithelial / LPF FEW (*) RARE    RBC / HPF 11-20  <3 (RBC/hpf)   Bacteria, UA RARE  RARE    Urine-Other MUCOUS PRESENT    POCT PREGNANCY, URINE     Status: Normal   Collection Time   07/01/11  7:20 PM      Component Value Range   Preg Test, Ur NEGATIVE  NEGATIVE   CBC     Status: Normal   Collection Time   07/01/11  8:05 PM      Component Value Range   WBC 8.7  4.0 - 10.5 (K/uL)   RBC 4.77  3.87 - 5.11 (MIL/uL)   Hemoglobin 13.7  12.0 - 15.0 (g/dL)   HCT 40.9  81.1 - 91.4 (%)   MCV 89.1  78.0 - 100.0 (fL)   MCH 28.7  26.0 - 34.0 (pg)   MCHC 32.2  30.0 - 36.0 (g/dL)   RDW 78.2  95.6 - 21.3 (%)   Platelets 244  150 - 400 (K/uL)    MDM: patient in ultrasound. Care turned over to [facility]          [place], NP 07/01/11 2129  I have assumed care of this pt from [place], FNP.  US Transvaginal Non-ob  07/01/2011  *RADIOLOGY REPORT*  Clinical Data: Spontaneous abortion December 2012. Pelvic pain with abnormal vaginal bleeding. Negative urine pregnancy test.  TRANSABDOMINAL AND TRANSVAGINAL ULTRASOUND OF PELVIS  Technique:  Both transabdominal and transvaginal ultrasound examinations of the pelvis were performed. Transabdominal technique was performed for global imaging of the pelvis including uterus, ovaries, adnexal regions, and pelvic cul-de-sac.  Comparison: Obstetrical ultrasound 03/26/2011.   It was necessary to proceed with endovaginal exam  following the transabdominal exam to visualize the endometrium to better advantage.  Findings:  Uterus: Measures approximately 9.9 x 5.2 x 6.1 cm.  No myometrial abnormalities are identified.  There is a complex fluid within the endometrial canal with multiple septations.  This fluid measures up to 1.7 cm in thickness.  No significant echogenic or hypervascular componentsare demonstrated.  Right ovary:  Normal in appearance, measuring 3.1 x 1.9 x 1.9 cm. No adnexal mass.  Left ovary: Normal in appearance, measuring 2.6 x 1.8 x 1.8 cm.  No adnexal mass.  Other findings: No free fluid  IMPRESSION: Complex septated fluid within the endometrial canal without typical findings of retained products of conception. Infection not excluded.  Correlate clinically.  No adnexal abnormalities.  Original Report Authenticated By: Gerrianne Scale, M.D.   US Pelvis Complete  07/01/2011  *RADIOLOGY REPORT*  Clinical Data: Spontaneous abortion December 2012. Pelvic pain with abnormal vaginal bleeding. Negative urine pregnancy test.  TRANSABDOMINAL AND TRANSVAGINAL ULTRASOUND OF PELVIS Technique:  Both transabdominal and transvaginal ultrasound examinations of the pelvis were performed. Transabdominal technique was performed for global imaging of the pelvis including uterus, ovaries, adnexal regions, and pelvic cul-de-sac.  Comparison: Obstetrical ultrasound 03/26/2011.   It was necessary to proceed with endovaginal exam following the transabdominal exam to visualize the endometrium to better advantage.  Findings:  Uterus: Measures approximately 9.9 x 5.2 x 6.1 cm.  No myometrial abnormalities are  identified.  There is a complex fluid within the endometrial canal with multiple septations.  This fluid measures up to 1.7 cm in thickness.  No significant echogenic or hypervascular componentsare demonstrated.  Right ovary:  Normal in appearance, measuring 3.1 x 1.9 x 1.9 cm. No adnexal mass.  Left ovary: Normal in appearance, measuring 2.6 x 1.8 x 1.8 cm.  No adnexal mass.  Other findings: No free fluid  IMPRESSION: Complex septated fluid within the endometrial canal without typical findings of retained products of conception. Infection not excluded.  Correlate clinically.  No adnexal abnormalities.  Original Report Authenticated By: Gerrianne Scale, M.D.   Results for orders placed during the hospital encounter of 07/01/11 (from the past 24 hour(s))  URINALYSIS, ROUTINE W REFLEX MICROSCOPIC     Status: Abnormal   Collection Time   07/01/11  7:18 PM      Component Value Range   Color, Urine YELLOW  YELLOW    APPearance HAZY (*) CLEAR    Specific Gravity, Urine 1.025  1.005 - 1.030    pH 6.0  5.0 - 8.0    Glucose, UA NEGATIVE  NEGATIVE (mg/dL)   Hgb urine dipstick LARGE (*) NEGATIVE    Bilirubin Urine NEGATIVE  NEGATIVE    Ketones, ur NEGATIVE  NEGATIVE (mg/dL)   Protein, ur NEGATIVE  NEGATIVE (mg/dL)   Urobilinogen, UA 0.2  0.0 - 1.0 (mg/dL)   Nitrite NEGATIVE  NEGATIVE    Leukocytes, UA NEGATIVE  NEGATIVE   URINE MICROSCOPIC-ADD ON     Status: Abnormal   Collection Time   07/01/11  7:18 PM      Component Value Range   Squamous Epithelial / LPF FEW (*) RARE    RBC / HPF 11-20  <3 (RBC/hpf)   Bacteria, UA RARE  RARE    Urine-Other MUCOUS PRESENT    POCT PREGNANCY, URINE     Status: Normal   Collection Time   07/01/11  7:20 PM      Component Value Range   Preg Test, Ur NEGATIVE  NEGATIVE  CBC     Status: Normal   Collection Time   07/01/11  8:05 PM      Component Value Range   WBC 8.7  4.0 - 10.5 (K/uL)   RBC 4.77  3.87 - 5.11 (MIL/uL)   Hemoglobin 13.7  12.0 - 15.0 (g/dL)   HCT  16.1  09.6 - 04.5 (%)   MCV 89.1  78.0 - 100.0 (fL)   MCH 28.7  26.0 - 34.0 (pg)   MCHC 32.2  30.0 - 36.0 (g/dL)   RDW 40.9  81.1 - 91.4 (%)   Platelets 244  150 - 400 (K/uL)  WET PREP, GENITAL     Status: Abnormal   Collection Time   07/01/11  8:33 PM      Component Value Range   Yeast Wet Prep HPF POC FEW (*) NONE SEEN    Trich, Wet Prep NONE SEEN  NONE SEEN    Clue Cells Wet Prep HPF POC MODERATE (*) NONE SEEN    WBC, Wet Prep HPF POC MODERATE (*) NONE SEEN     Irregular bleeding: discussed with pt at length. Will tx presumptively for infection. Pt given Rocephin and azithromycin in the [facility]U. Also discussed pt with Dr. Debroah Loop. Will begin OCPs. She will f/u in the clinic in the next 2wks. She may need repeat US in the near future. If symptoms continue must also consider Asherman's syndrome. Discussed with pt at length including pros, cons, risks, and alternatives.  Clinton Gallant. Delsy Etzkorn III, DrHSc, MPAS, PA-C   Henrietta Hoover, PA 07/01/11 2247  Henrietta Hoover, PA 07/01/11 2251

## 2011-07-02 LAB — GC/CHLAMYDIA PROBE AMP, GENITAL
Chlamydia, DNA Probe: NEGATIVE
GC Probe Amp, Genital: NEGATIVE

## 2011-07-03 ENCOUNTER — Telehealth (HOSPITAL_COMMUNITY): Payer: Self-pay | Admitting: *Deleted

## 2011-07-22 ENCOUNTER — Encounter: Payer: Self-pay | Admitting: Advanced Practice Midwife

## 2011-07-22 ENCOUNTER — Ambulatory Visit (INDEPENDENT_AMBULATORY_CARE_PROVIDER_SITE_OTHER): Payer: 59 | Admitting: Advanced Practice Midwife

## 2011-07-22 VITALS — BP 124/81 | HR 82 | Temp 98.9°F | Ht 65.5 in | Wt 157.1 lb

## 2011-07-22 DIAGNOSIS — N898 Other specified noninflammatory disorders of vagina: Secondary | ICD-10-CM

## 2011-07-22 DIAGNOSIS — O039 Complete or unspecified spontaneous abortion without complication: Secondary | ICD-10-CM

## 2011-07-22 DIAGNOSIS — N939 Abnormal uterine and vaginal bleeding, unspecified: Secondary | ICD-10-CM

## 2011-07-22 NOTE — Patient Instructions (Signed)
Abnormal Uterine Bleeding Abnormal uterine bleeding can have many causes. Some cases are simply treated, while others are more serious. There are several kinds of bleeding that is considered abnormal, including:  Bleeding between periods.   Bleeding after sexual intercourse.   Spotting anytime in the menstrual cycle.   Bleeding heavier or more than normal.   Bleeding after menopause.  CAUSES  There are many causes of abnormal uterine bleeding. It can be present in teenagers, pregnant women, women during their reproductive years, and women who have reached menopause. Your caregiver will look for the more common causes depending on your age, signs, symptoms and your particular circumstance. Most cases are not serious and can be treated. Even the more serious causes, like cancer of the female organs, can be treated adequately if found in the early stages. That is why all types of bleeding should be evaluated and treated as soon as possible. DIAGNOSIS  Diagnosing the cause may take several kinds of tests. Your caregiver may:  Take a complete history of the type of bleeding.   Perform a complete physical exam and Pap smear.   Take an ultrasound on the abdomen showing a picture of the female organs and the pelvis.   Inject dye into the uterus and Fallopian tubes and X-ray them (hysterosalpingogram).   Place fluid in the uterus and do an ultrasound (sonohysterogrqphy).   Take a CT scan to examine the female organs and pelvis.   Take an MRI to examine the female organs and pelvis. There is no X-ray involved with this procedure.   Look inside the uterus with a telescope that has a light at the end (hysteroscopy).   Scrap the inside of the uterus to get tissue to examine (Dilatation and Curettage, D&C).   Look into the pelvis with a telescope that has a light at the end (laparoscopy). This is done through a very small cut (incision) in the abdomen.  TREATMENT  Treatment will depend on the  cause of the abnormal bleeding. It can include:  Doing nothing to allow the problem to take care of itself over time.   Hormone treatment.   Birth control pills.   Treating the medical condition causing the problem.   Laparoscopy.   Major or minor surgery   Destroying the lining of the uterus with electrical currant, laser, freezing or heat (uterine ablation).  HOME CARE INSTRUCTIONS   Follow your caregiver's recommendation on how to treat your problem.   See your caregiver if you missed a menstrual period and think you may be pregnant.   If you are bleeding heavily, count the number of pads/tampons you use and how often you have to change them. Tell this to your caregiver.   Avoid sexual intercourse until the problem is controlled.  SEEK MEDICAL CARE IF:   You have any kind of abnormal bleeding mentioned above.   You feel dizzy at times.   You are 26 years old and have not had a menstrual period yet.  SEEK IMMEDIATE MEDICAL CARE IF:   You pass out.   You are changing pads/tampons every 15 to 30 minutes.   You have belly (abdominal) pain.   You have a temperature of 100 F (37.8 C) or higher.   You become sweaty or weak.   You are passing large blood clots from the vagina.   You start to feel sick to your stomach (nauseous) and throw up (vomit).  Document Released: 04/12/2005 Document Revised: 04/01/2011 Document Reviewed: 09/05/2008 [employer]   Patient Information 2012 [employer]. 

## 2011-07-23 DIAGNOSIS — O039 Complete or unspecified spontaneous abortion without complication: Secondary | ICD-10-CM | POA: Insufficient documentation

## 2011-07-23 NOTE — Progress Notes (Signed)
  Subjective:    Patient ID: Sydney Walters, female    DOB: [birth year], 26 y.o.   MRN: [id number]  HPI  This is a 26 y.o. who presents for followup after a SAB. States she had some bleeding in late January followed by heavier bleeding in Feb.  Started on OCPs as prescribed and bleeding stopped. Now has a little spotting, and this is the week her period would be due. She is on the middle of the 3rd row of pills.  No further passage of clots and no fever or pain.  Previous note from 07/01/11 HPI Kynnedi Zweig is a 26 y.o. female who presents to [facility] for vaginal bleeding.History of SAB in December and did not go for follow up as instructed and wanted to be sure not sill having bleeding associated with the pregnancy. Onset of bleeding 06/21/11 started bleeding like a normal period got lighter and then spotting but then started getting heavier 06/25/11 and has continue with bright red blood. Cramping lower abdominal pain. The history was provided by the patient and her past medical record  Review of Systems As above.     Objective:   Physical Exam  Constitutional: She is oriented to person, place, and time. She appears well-developed and well-nourished. No distress.  Pulmonary/Chest: Effort normal.  Neurological: She is alert and oriented to person, place, and time.  Skin: Skin is warm and dry.  Psychiatric: She has a normal mood and affect.   Pelvic exam deferred per pt request     Assessment & Plan:  A:  S/P SAB in December      Spotting, probably due to instability of endometrium        Heavy menses in February with no continued bleeding  P:  Reassured patient the bleeding in Feb is not unusual s/p SAB      Advised she may have spotting then will probably have period next week      Continue pills as scheduled      Return to clinic as needed

## 2011-09-15 ENCOUNTER — Other Ambulatory Visit: Payer: Self-pay | Admitting: Family Medicine

## 2011-09-15 DIAGNOSIS — Z0000 Encounter for general adult medical examination without abnormal findings: Secondary | ICD-10-CM | POA: Insufficient documentation

## 2011-09-15 DIAGNOSIS — Z136 Encounter for screening for cardiovascular disorders: Secondary | ICD-10-CM

## 2011-09-16 ENCOUNTER — Encounter: Payer: Self-pay | Admitting: Family Medicine

## 2011-09-16 ENCOUNTER — Ambulatory Visit (INDEPENDENT_AMBULATORY_CARE_PROVIDER_SITE_OTHER): Payer: 59 | Admitting: Family Medicine

## 2011-09-16 VITALS — BP 110/80 | HR 72 | Temp 98.0°F | Wt 156.0 lb

## 2011-09-16 DIAGNOSIS — T148XXA Other injury of unspecified body region, initial encounter: Secondary | ICD-10-CM

## 2011-09-16 DIAGNOSIS — W57XXXA Bitten or stung by nonvenomous insect and other nonvenomous arthropods, initial encounter: Secondary | ICD-10-CM | POA: Insufficient documentation

## 2011-09-16 DIAGNOSIS — T148 Other injury of unspecified body region: Secondary | ICD-10-CM

## 2011-09-16 MED ORDER — DOXYCYCLINE HYCLATE 100 MG PO TABS: 100.0000 mg | ORAL_TABLET | Freq: Two times a day (BID) | ORAL | Status: AC

## 2011-09-16 NOTE — Patient Instructions (Signed)
Deer Tick Bite Deer ticks are brown arachnids (spider family) that vary in size from as small as the head of a pin to 1/4 inch (1/2 cm) diameter. They thrive in wooded areas. Deer are the preferred host of adult deer ticks. Small rodents are the host of young ticks (nymphs). When a person walks in a field or wooded area, young and adult ticks in the surrounding grass and vegetation can attach themselves to the skin. They can suck blood for hours to days if unnoticed. Ticks are found all over the [place] Some ticks carry a specific bacteria (Borrelia burgdorferi) that causes an infection called Lyme disease. The bacteria is typically passed into a person during the blood sucking process. This happens after the tick has been attached for at least a number of hours. While ticks can be found all over the [place] those carrying the bacteria that causes Lyme disease are most common in [place]. Only a small proportion of ticks in these areas carry the Lyme disease bacteria and cause human infections. Ticks usually attach to warm spots on the body, such as the:  Head.   Back.   Neck.   Armpits.   Groin.  SYMPTOMS  Most of the time, a deer tick bite will not be felt. You may or may not see the attached tick. You may notice mild irritation or redness around the bite site. If the deer tick passes the Lyme disease bacteria to a person, a round, red rash may be noticed 2 to 3 days after the bite. The rash may be clear in the middle, like a bull's-eye or target. If not treated, other symptoms may develop several days to weeks after the onset of the rash. These symptoms may include:  New rash lesions.   Fatigue and weakness.   General ill feeling and achiness.   Chills.   Headache and neck pain.   Swollen lymph glands.   Sore muscles and joints.  5 to 15% of untreated people with Lyme disease may develop more severe illnesses after several weeks to months. This may include inflammation  of the brain lining (meningitis), nerve palsies, an abnormal heartbeat, or severe muscle and joint pain and inflammation (myositis or arthritis). DIAGNOSIS   Physical exam and medical history.   Viewing the tick if it was saved for confirmation.   Blood tests (to check or confirm the presence of Lyme disease).  TREATMENT  Most ticks do not carry disease. If found, an attached tick should be removed using tweezers. Tweezers should be placed under the body of the tick so it is removed by its attachment parts (pincers). If there are signs or symptoms of being sick, or Lyme disease is confirmed, medicines (antibiotics) that kill germs are usually prescribed. In more severe cases, antibiotics may be given through an intravenous (IV) access. HOME CARE INSTRUCTIONS   Always remove ticks with tweezers. Do not use petroleum jelly or other methods to kill or remove the tick. Slide the tweezers under the body and pull out as much as you can. If you are not sure what it is, save it in a jar and show your caregiver.   Once you remove the tick, the skin will heal on its own. Wash your hands and the affected area with water and soap. You may place a bandage on the affected area.   Take medicine as directed. You may be advised to take a full course of antibiotics.   Follow up  be advised to take a full course of antibiotics.   Follow up with your caregiver as recommended.  FINDING OUT THE RESULTS OF YOUR TEST  Not all test results are available during your visit. If your test results are not back during the visit, make an appointment with your caregiver to find out the results. Do not assume everything is normal if you have not heard from your caregiver or the medical facility. It is important for you to follow up on all of your test results.  PROGNOSIS   If Lyme disease is confirmed, early treatment with antibiotics is very effective. Following preventive guidelines is important since it is possible to get the disease more than once.  PREVENTION    Wear long sleeves and long pants in  wooded or grassy areas. Tuck your pants into your socks.   Use an insect repellent while hiking.   Check yourself, your children, and your pets regularly for ticks after playing outside.   Clear piles of leaves or brush from your yard. Ticks might live there.  SEEK MEDICAL CARE IF:    You or your child has an oral temperature above 102 F (38.9 C).   You develop a severe headache following the bite.   You feel generally ill.   You notice a rash.   You are having trouble removing the tick.   The bite area has red skin or yellow drainage.  SEEK IMMEDIATE MEDICAL CARE IF:    Your face is weak and droopy or you have other neurological symptoms.   You have severe joint pain or weakness.  MAKE SURE YOU:    Understand these instructions.   Will watch your condition.   Will get help right away if you are not doing well or get worse.  FOR MORE INFORMATION  Centers for Disease Control and Prevention: www.cdc.gov  American Academy of Family Physicians: www.aafp.org  Document Released: 07/07/2009 Document Revised: 04/01/2011 Document Reviewed: 07/07/2009  [employer] Patient Information 2012 [employer].

## 2011-09-16 NOTE — Progress Notes (Signed)
Subjective:    Patient ID: Sydney Walters, female    DOB: 08-07-1985, 26 y.o.   MRN: [id number]  HPI  26 yo here with tick bite in right groin.  Tick removed on Saturday, she is unsure how long it was there. Not sure if it was engorged.  Now has some redness and inflammation around where the tick was embedded.  Has had some nausea and fatigue since tick was removed. No fevers, HA, photophobia or MS changes.  Patient Active Problem List  Diagnoses  . DEPRESSION  . ALLERGIC RHINITIS  . ASTHMA  . FATIGUE  . ABDOMINAL PAIN OTHER SPECIFIED SITE  . Otalgia  . Complete spontaneous abortion  . Routine general medical examination at a health care facility  . Tick bite   Past Medical History  Diagnosis Date  . Asthma   . Depression     hx of med usuage, doing ok currently  . Abnormal Pap smear     bx and follow up were normal  . Hypoglycemia   . Allergy     seasonal  . Anxiety    Past Surgical History  Procedure Date  . Induced abortion   . Eye surgery 05/01/2010    lasik    History  Substance Use Topics  . Smoking status: Former Smoker    Quit date: 06/24/2008  . Smokeless tobacco: Never Used  . Alcohol Use: No   Family History  Problem Relation Age of Onset  . Coronary artery disease Other   . Thyroid disease Other   . Stroke Other   . Anesthesia problems Neg Hx   . Depression Mother   . Carpal tunnel syndrome Mother   . Anxiety disorder Mother   . Other      hypoglycemia  . Graves' disease Father   . Hypertension Father   . Mitral valve prolapse Father   . Heart disease Father     Congestive heart disease  . Diabetes Father    Allergies  Allergen Reactions  . Mushroom Extract Complex Anaphylaxis   Current Outpatient Prescriptions on File Prior to Visit  Medication Sig Dispense Refill  . Blood Glucose Monitoring Suppl (BLOOD GLUCOSE MONITOR KIT) KIT (One Touch basic system w/device kit) use as directed       . glucose blood test strip 1 each. (One  Touch test strips) use as directed       . levocetirizine (XYZAL) 5 MG tablet Take 5 mg by mouth every evening.      . Multiple Vitamins-Minerals (MULTIVITAMINS THER. W/MINERALS) TABS Take 1 tablet by mouth daily.        . norgestrel-ethinyl estradiol (LO/OVRAL) 0.3-30 MG-MCG tablet Take 1 tablet by mouth daily.  1 Package  11  . Polyethyl Glycol-Propyl Glycol (SYSTANE) 0.4-0.3 % SOLN Apply 1 drop to eye daily as needed. Dryness       . Sertraline HCl (ZOLOFT PO) Take 1 tablet by mouth at bedtime. Pt does not know strength      . DISCONTD: misoprostol (CYTOTEC) 200 MCG tablet Take 1 tablet (200 mcg total) by mouth 4 (four) times daily.  4 tablet  0   The PMH, PSH, Social History, Family History, Medications, and allergies have been reviewed in [facility], and have been updated if relevant.   Review of Systems    No other rashes Objective:   Physical Exam BP 110/80  Pulse 72  Temp 98 F (36.7 C)  Wt 156 lb (70.761 kg) Gen:  Alert, NAD Skin:  1.5 x 1 cm area of raised erythema on right inner thigh/groin area, no central clearing, no other rashes.    Assessment & Plan:   1. Tick bite    New- rash looks like localized reaction, not tick borne illness rash. However, she is having some systemic symptoms that may or may not be related. Non toxic.  Will place on 14 day course of doxycyline. Pt to update me with symptoms next week. The patient indicates understanding of these issues and agrees with the plan.

## 2011-09-24 ENCOUNTER — Other Ambulatory Visit (INDEPENDENT_AMBULATORY_CARE_PROVIDER_SITE_OTHER): Payer: 59

## 2011-09-24 DIAGNOSIS — Z136 Encounter for screening for cardiovascular disorders: Secondary | ICD-10-CM

## 2011-09-24 DIAGNOSIS — Z0000 Encounter for general adult medical examination without abnormal findings: Secondary | ICD-10-CM

## 2011-09-24 LAB — CBC WITH DIFFERENTIAL/PLATELET
Eosinophils Relative: 2.7 % (ref 0.0–5.0)
HCT: 38.4 % (ref 36.0–46.0)
Lymphocytes Relative: 24.8 % (ref 12.0–46.0)
Lymphs Abs: 1.7 10*3/uL (ref 0.7–4.0)
Monocytes Relative: 7.7 % (ref 3.0–12.0)
Neutrophils Relative %: 64.4 % (ref 43.0–77.0)
Platelets: 253 10*3/uL (ref 150.0–400.0)
WBC: 6.9 10*3/uL (ref 4.5–10.5)

## 2011-09-24 LAB — LIPID PANEL
HDL: 41 mg/dL (ref >39.00)
LDL Cholesterol: 96 mg/dL (ref 0–99)
Total CHOL/HDL Ratio: 4

## 2011-09-24 LAB — COMPREHENSIVE METABOLIC PANEL
ALT: 15 U/L (ref 0–35)
AST: 24 U/L (ref 0–37)
Alkaline Phosphatase: 45 U/L (ref 39–117)
Calcium: 8.8 mg/dL (ref 8.4–10.5)
Chloride: 107 mEq/L (ref 96–112)
Creatinine, Ser: 0.7 mg/dL (ref 0.4–1.2)
Potassium: 3.7 mEq/L (ref 3.5–5.1)

## 2011-09-30 ENCOUNTER — Other Ambulatory Visit (HOSPITAL_COMMUNITY)
Admission: RE | Admit: 2011-09-30 | Discharge: 2011-09-30 | Disposition: A | Discharge status: Home / Self Care | Payer: 59 | Source: Ambulatory Visit | Attending: Family Medicine | Admitting: Family Medicine

## 2011-09-30 ENCOUNTER — Ambulatory Visit (INDEPENDENT_AMBULATORY_CARE_PROVIDER_SITE_OTHER): Payer: 59 | Admitting: Family Medicine

## 2011-09-30 ENCOUNTER — Encounter: Payer: Self-pay | Admitting: Family Medicine

## 2011-09-30 VITALS — BP 100/62 | HR 72 | Temp 98.5°F | Ht 65.25 in | Wt 159.0 lb

## 2011-09-30 DIAGNOSIS — Z23 Encounter for immunization: Secondary | ICD-10-CM

## 2011-09-30 DIAGNOSIS — R300 Dysuria: Secondary | ICD-10-CM

## 2011-09-30 DIAGNOSIS — R5381 Other malaise: Secondary | ICD-10-CM

## 2011-09-30 DIAGNOSIS — Z0000 Encounter for general adult medical examination without abnormal findings: Secondary | ICD-10-CM

## 2011-09-30 DIAGNOSIS — Z01419 Encounter for gynecological examination (general) (routine) without abnormal findings: Secondary | ICD-10-CM | POA: Insufficient documentation

## 2011-09-30 DIAGNOSIS — Z113 Encounter for screening for infections with a predominantly sexual mode of transmission: Secondary | ICD-10-CM | POA: Insufficient documentation

## 2011-09-30 LAB — POCT URINALYSIS DIPSTICK
Bilirubin, UA: NEGATIVE
Glucose, UA: NEGATIVE
Leukocytes, UA: NEGATIVE
Nitrite, UA: NEGATIVE

## 2011-09-30 LAB — URINALYSIS, ROUTINE W REFLEX MICROSCOPIC
Ketones, ur: NEGATIVE
Leukocytes, UA: NEGATIVE
Specific Gravity, Urine: 1.025 (ref 1.000–1.030)
Urobilinogen, UA: 0.2 (ref 0.0–1.0)

## 2011-09-30 LAB — T4, FREE: Free T4: 0.69 ng/dL (ref 0.60–1.60)

## 2011-09-30 LAB — TSH: TSH: 0.72 u[IU]/mL (ref 0.35–5.50)

## 2011-09-30 LAB — VITAMIN B12: Vitamin B-12: 168 pg/mL — ABNORMAL LOW (ref 211–911)

## 2011-09-30 NOTE — Patient Instructions (Addendum)
Great to see you. We will call you with your results from today.  Preventive Care for Adults, Female A healthy lifestyle and preventive care can promote health and wellness. Preventive health guidelines for women include the following key practices.  A routine yearly physical is a good way to check with your caregiver about your health and preventive screening. It is a chance to share any concerns and updates on your health, and to receive a thorough exam.   Visit your dentist for a routine exam and preventive care every 6 months. Brush your teeth twice a day and floss once a day. Good oral hygiene prevents tooth decay and gum disease.   The frequency of eye exams is based on your age, health, family medical history, use of contact lenses, and other factors. Follow your caregiver's recommendations for frequency of eye exams.   Eat a healthy diet. Foods like vegetables, fruits, whole grains, low-fat dairy products, and lean protein foods contain the nutrients you need without too many calories. Decrease your intake of foods high in solid fats, added sugars, and salt. Eat the right amount of calories for you.Get information about a proper diet from your caregiver, if necessary.   Regular physical exercise is one of the most important things you can do for your health. Most adults should get at least 150 minutes of moderate-intensity exercise (any activity that increases your heart rate and causes you to sweat) each week. In addition, most adults need muscle-strengthening exercises on 2 or more days a week.   Maintain a healthy weight. The body mass index (BMI) is a screening tool to identify possible weight problems. It provides an estimate of body fat based on height and weight. Your caregiver can help determine your BMI, and can help you achieve or maintain a healthy weight.For adults 20 years and older:   A BMI below 18.5 is considered underweight.   A BMI of 18.5 to 24.9 is normal.   A BMI  of 25 to 29.9 is considered overweight.   A BMI of 30 and above is considered obese.   Maintain normal blood lipids and cholesterol levels by exercising and minimizing your intake of saturated fat. Eat a balanced diet with plenty of fruit and vegetables. Blood tests for lipids and cholesterol should begin at age 34 and be repeated every 5 years. If your lipid or cholesterol levels are high, you are over 50, or you are at high risk for heart disease, you may need your cholesterol levels checked more frequently.Ongoing high lipid and cholesterol levels should be treated with medicines if diet and exercise are not effective.   If you smoke, find out from your caregiver how to quit. If you do not use tobacco, do not start.   If you are pregnant, do not drink alcohol. If you are breastfeeding, be very cautious about drinking alcohol. If you are not pregnant and choose to drink alcohol, do not exceed 1 drink per day. One drink is considered to be 12 ounces (355 mL) of beer, 5 ounces (148 mL) of wine, or 1.5 ounces (44 mL) of liquor.   Avoid use of street drugs. Do not share needles with anyone. Ask for help if you need support or instructions about stopping the use of drugs.   High blood pressure causes heart disease and increases the risk of stroke. Your blood pressure should be checked at least every 1 to 2 years. Ongoing high blood pressure should be treated with medicines if weight  loss and exercise are not effective.   If you are 31 to 26 years old, ask your caregiver if you should take aspirin to prevent strokes.   Diabetes screening involves taking a blood sample to check your fasting blood sugar level. This should be done once every 3 years, after age 30, if you are within normal weight and without risk factors for diabetes. Testing should be considered at a younger age or be carried out more frequently if you are overweight and have at least 1 risk factor for diabetes.   Breast cancer screening  is essential preventive care for women. You should practice "breast self-awareness." This means understanding the normal appearance and feel of your breasts and may include breast self-examination. Any changes detected, no matter how small, should be reported to a caregiver. Women in their 71s and 30s should have a clinical breast exam (CBE) by a caregiver as part of a regular health exam every 1 to 3 years. After age 2, women should have a CBE every year. Starting at age 70, women should consider having a mammography (breast X-ray test) every year. Women who have a family history of breast cancer should talk to their caregiver about genetic screening. Women at a high risk of breast cancer should talk to their caregivers about having magnetic resonance imaging (MRI) and a mammography every year.   The Pap test is a screening test for cervical cancer. A Pap test can show cell changes on the cervix that might become cervical cancer if left untreated. A Pap test is a procedure in which cells are obtained and examined from the lower end of the uterus (cervix).   Women should have a Pap test starting at age 34.   Between ages 22 and 62, Pap tests should be repeated every 2 years.   Beginning at age 39, you should have a Pap test every 3 years as long as the past 3 Pap tests have been normal.   Some women have medical problems that increase the chance of getting cervical cancer. Talk to your caregiver about these problems. It is especially important to talk to your caregiver if a new problem develops soon after your last Pap test. In these cases, your caregiver may recommend more frequent screening and Pap tests.   The above recommendations are the same for women who have or have not gotten the vaccine for human papillomavirus (HPV).   If you had a hysterectomy for a problem that was not cancer or a condition that could lead to cancer, then you no longer need Pap tests. Even if you no longer need a Pap  test, a regular exam is a good idea to make sure no other problems are starting.   If you are between ages 47 and 46, and you have had normal Pap tests going back 10 years, you no longer need Pap tests. Even if you no longer need a Pap test, a regular exam is a good idea to make sure no other problems are starting.   If you have had past treatment for cervical cancer or a condition that could lead to cancer, you need Pap tests and screening for cancer for at least 20 years after your treatment.   If Pap tests have been discontinued, risk factors (such as a new sexual partner) need to be reassessed to determine if screening should be resumed.   The HPV test is an additional test that may be used for cervical cancer screening. The HPV  test looks for the virus that can cause the cell changes on the cervix. The cells collected during the Pap test can be tested for HPV. The HPV test could be used to screen women aged 54 years and older, and should be used in women of any age who have unclear Pap test results. After the age of 28, women should have HPV testing at the same frequency as a Pap test.   Colorectal cancer can be detected and often prevented. Most routine colorectal cancer screening begins at the age of 79 and continues through age 78. However, your caregiver may recommend screening at an earlier age if you have risk factors for colon cancer. On a yearly basis, your caregiver may provide home test kits to check for hidden blood in the stool. Use of a small camera at the end of a tube, to directly examine the colon (sigmoidoscopy or colonoscopy), can detect the earliest forms of colorectal cancer. Talk to your caregiver about this at age 37, when routine screening begins. Direct examination of the colon should be repeated every 5 to 10 years through age 46, unless early forms of pre-cancerous polyps or small growths are found.   Hepatitis C blood testing is recommended for all people born from [birth year]  through 1965 and any individual with known risks for hepatitis C.   Practice safe sex. Use condoms and avoid high-risk sexual practices to reduce the spread of sexually transmitted infections (STIs). STIs include gonorrhea, chlamydia, syphilis, trichomonas, herpes, HPV, and human immunodeficiency virus (HIV). Herpes, HIV, and HPV are viral illnesses that have no cure. They can result in disability, cancer, and death. Sexually active women aged 16 and younger should be checked for chlamydia. Older women with new or multiple partners should also be tested for chlamydia. Testing for other STIs is recommended if you are sexually active and at increased risk.   Osteoporosis is a disease in which the bones lose minerals and strength with aging. This can result in serious bone fractures. The risk of osteoporosis can be identified using a bone density scan. Women ages 37 and over and women at risk for fractures or osteoporosis should discuss screening with their caregivers. Ask your caregiver whether you should take a calcium supplement or vitamin D to reduce the rate of osteoporosis.   Menopause can be associated with physical symptoms and risks. Hormone replacement therapy is available to decrease symptoms and risks. You should talk to your caregiver about whether hormone replacement therapy is right for you.   Use sunscreen with sun protection factor (SPF) of 30 or more. Apply sunscreen liberally and repeatedly throughout the day. You should seek shade when your shadow is shorter than you. Protect yourself by wearing long sleeves, pants, a wide-brimmed hat, and sunglasses year round, whenever you are outdoors.   Once a month, do a whole body skin exam, using a mirror to look at the skin on your back. Notify your caregiver of new moles, moles that have irregular borders, moles that are larger than a pencil eraser, or moles that have changed in shape or color.   Stay current with required immunizations.    Influenza. You need a dose every fall (or winter). The composition of the flu vaccine changes each year, so being vaccinated once is not enough.   Pneumococcal polysaccharide. You need 1 to 2 doses if you smoke cigarettes or if you have certain chronic medical conditions. You need 1 dose at age 50 (or older) if you have  never been vaccinated.   Tetanus, diphtheria, pertussis (Tdap, Td). Get 1 dose of Tdap vaccine if you are younger than age 59, are over 78 and have contact with an infant, are a Research scientist (physical sciences), are pregnant, or simply want to be protected from whooping cough. After that, you need a Td booster dose every 10 years. Consult your caregiver if you have not had at least 3 tetanus and diphtheria-containing shots sometime in your life or have a deep or dirty wound.   HPV. You need this vaccine if you are a woman age 94 or younger. The vaccine is given in 3 doses over 6 months.   Measles, mumps, rubella (MMR). You need at least 1 dose of MMR if you were born in 1957 or later. You may also need a second dose.   Meningococcal. If you are age 5 to 78 and a first-year college student living in a residence hall, or have one of several medical conditions, you need to get vaccinated against meningococcal disease. You may also need additional booster doses.   Zoster (shingles). If you are age 2 or older, you should get this vaccine.   Varicella (chickenpox). If you have never had chickenpox or you were vaccinated but received only 1 dose, talk to your caregiver to find out if you need this vaccine.   Hepatitis A. You need this vaccine if you have a specific risk factor for hepatitis A virus infection or you simply wish to be protected from this disease. The vaccine is usually given as 2 doses, 6 to 18 months apart.   Hepatitis B. You need this vaccine if you have a specific risk factor for hepatitis B virus infection or you simply wish to be protected from this disease. The vaccine is given  in 3 doses, usually over 6 months.  Preventive Services / Frequency Ages 47 to 73  Blood pressure check.** / Every 1 to 2 years.   Lipid and cholesterol check.** / Every 5 years beginning at age 56.   Clinical breast exam.** / Every 3 years for women in their 100s and 30s.   Pap test.** / Every 2 years from ages 72 through 62. Every 3 years starting at age 54 through age 9 or 72 with a history of 3 consecutive normal Pap tests.   HPV screening.** / Every 3 years from ages 67 through ages 62 to 15 with a history of 3 consecutive normal Pap tests.   Hepatitis C blood test.** / For any individual with known risks for hepatitis C.   Skin self-exam. / Monthly.   Influenza immunization.** / Every year.   Pneumococcal polysaccharide immunization.** / 1 to 2 doses if you smoke cigarettes or if you have certain chronic medical conditions.   Tetanus, diphtheria, pertussis (Tdap, Td) immunization. / A one-time dose of Tdap vaccine. After that, you need a Td booster dose every 10 years.   HPV immunization. / 3 doses over 6 months, if you are 57 and younger.   Measles, mumps, rubella (MMR) immunization. / You need at least 1 dose of MMR if you were born in 1957 or later. You may also need a second dose.   Meningococcal immunization. / 1 dose if you are age 4 to 28 and a first-year college student living in a residence hall, or have one of several medical conditions, you need to get vaccinated against meningococcal disease. You may also need additional booster doses.   Varicella immunization.** / Consult your caregiver.  Hepatitis A immunization.** / Consult your caregiver. 2 doses, 6 to 18 months apart.   Hepatitis B immunization.** / Consult your caregiver. 3 doses usually over 6 months.

## 2011-09-30 NOTE — Progress Notes (Signed)
26 yo here for CPX.  Adjustment disorder with depressive mood- followed by Wendall Mola (psych). On Zoloft daily since her father died.  Unfortunately, had a SAB in November she is feels she is adjusting ok.  Does have significant fatigue for past 6 months.  Feels she could sleep all night and not feel well rested.  No SOB.  No DOE. Does not feel depressed. No changes in her bowels, hair or skin.  Lab Results  Component Value Date   WBC 6.9 09/24/2011   HGB 12.6 09/24/2011   HCT 38.4 09/24/2011   MCV 88.3 09/24/2011   PLT 253.0 09/24/2011       Well woman- had a history of abnormal pap smears several years ago. All have been normal since then. Denies any vaginal discharge, dysuria or other symptoms.  Normal pap smear last year, but she would like another pap smear today. No dysuria although she is having more urinary frequency.  CBG was elevated but pt ate breakfast prior to lab draw.  Fasting CBG was normal last year.  Lab Results  Component Value Date   CHOL 167 09/24/2011   HDL 41.00 09/24/2011   LDLCALC 96 09/24/2011   TRIG 148.0 09/24/2011   CHOLHDL 4 09/24/2011     Patient Active Problem List  Diagnoses  . DEPRESSION  . ALLERGIC RHINITIS  . ASTHMA  . FATIGUE  . ABDOMINAL PAIN OTHER SPECIFIED SITE  . Otalgia  . Complete spontaneous abortion  . Routine general medical examination at a health care facility  . Tick bite   Past Medical History  Diagnosis Date  . Asthma   . Depression     hx of med usuage, doing ok currently  . Abnormal Pap smear     bx and follow up were normal  . Hypoglycemia   . Allergy     seasonal  . Anxiety    Past Surgical History  Procedure Date  . Induced abortion   . Eye surgery 05/01/2010    lasik    History  Substance Use Topics  . Smoking status: Former Smoker    Quit date: 06/24/2008  . Smokeless tobacco: Never Used  . Alcohol Use: No   Family History  Problem Relation Age of Onset  . Coronary artery disease Other   .  Thyroid disease Other   . Stroke Other   . Anesthesia problems Neg Hx   . Depression Mother   . Carpal tunnel syndrome Mother   . Anxiety disorder Mother   . Other      hypoglycemia  . Graves' disease Father   . Hypertension Father   . Mitral valve prolapse Father   . Heart disease Father     Congestive heart disease  . Diabetes Father    Allergies  Allergen Reactions  . Mushroom Extract Complex Anaphylaxis   Current Outpatient Prescriptions on File Prior to Visit  Medication Sig Dispense Refill  . Blood Glucose Monitoring Suppl (BLOOD GLUCOSE MONITOR KIT) KIT (One Touch basic system w/device kit) use as directed       . glucose blood test strip 1 each. (One Touch test strips) use as directed       . levocetirizine (XYZAL) 5 MG tablet Take 5 mg by mouth every evening.      . Multiple Vitamins-Minerals (MULTIVITAMINS THER. W/MINERALS) TABS Take 1 tablet by mouth daily.        . norgestrel-ethinyl estradiol (LO/OVRAL) 0.3-30 MG-MCG tablet Take 1 tablet by mouth daily.  1 Package  11  . Polyethyl Glycol-Propyl Glycol (SYSTANE) 0.4-0.3 % SOLN Apply 1 drop to eye daily as needed. Dryness       . Sertraline HCl (ZOLOFT PO) Take 1 tablet by mouth at bedtime. Pt does not know strength      . DISCONTD: misoprostol (CYTOTEC) 200 MCG tablet Take 1 tablet (200 mcg total) by mouth 4 (four) times daily.  4 tablet  0   The PMH, PSH, Social History, Family History, Medications, and allergies have been reviewed in [facility], and have been updated if relevant.  Review of Systems  See HPI  Patient reports no  vision/ hearing changes,anorexia, weight change, fever ,adenopathy, persistant / recurrent hoarseness, swallowing issues, chest pain, edema,persistant / recurrent cough, hemoptysis, dyspnea(rest, exertional, paroxysmal nocturnal), gastrointestinal  bleeding (melena, rectal bleeding), abdominal pain, excessive heart burn, GU symptoms(dysuria, hematuria, pyuria, voiding/incontinence  Issues) syncope,  focal weakness, severe memory loss, concerning skin lesions, depression, anxiety, abnormal bruising/bleeding, major joint swelling, breast masses or abnormal vaginal bleeding.    Physical Exam  BP 100/62  Pulse 72  Temp(Src) 98.5 F (36.9 C) (Oral)  Ht 5' 5.25" (1.657 m)  Wt 159 lb (72.122 kg)  BMI 26.26 kg/m2  LMP 09/11/2011  General:  Well-developed,well-nourished,in no acute distress; alert,appropriate and cooperative throughout examination Head:  normocephalic and atraumatic.   Eyes:  vision grossly intact, pupils equal, pupils round, and pupils reactive to light.   Ears:  R ear normal and L ear normal.   Nose:  no external deformity.   Mouth:  good dentition.   Neck:  No deformities, masses, or tenderness noted. Breasts:  No mass, nodules, thickening, tenderness, bulging, retraction, inflamation, nipple discharge or skin changes noted.   Lungs:  Normal respiratory effort, chest expands symmetrically. Lungs are clear to auscultation, no crackles or wheezes. Heart:  Normal rate and regular rhythm. S1 and S2 normal without gallop, murmur, click, rub or other extra sounds. Abdomen:  Bowel sounds positive,abdomen soft and non-tender without masses, organomegaly or hernias noted. Rectal:  no external abnormalities.   Genitalia:  Pelvic Exam:        External: normal female genitalia without lesions or masses        Vagina: normal without lesions or masses        Cervix: normal without lesions or masses        Adnexa: normal bimanual exam without masses or fullness        Uterus: normal by palpation        Pap smear: performed Msk:  No deformity or scoliosis noted of thoracic or lumbar spine.   Extremities:  No clubbing, cyanosis, edema, or deformity noted with normal full range of motion of all joints.   Neurologic:  alert & oriented X3 and gait normal.   Skin:  Intact without suspicious lesions or rashes Cervical Nodes:  No lymphadenopathy noted Axillary Nodes:  No palpable  lymphadenopathy Psych:  Cognition and judgment appear intact. Alert and cooperative with normal attention span and concentration. No apparent delusions, illusions, hallucinations  Assessment and Plan:  1. FATIGUE  New- likely multifactorial. Under more stress lately- she is followed by therapist.  Will check labs to rule out other possible causes. Vitamin D, 25-hydroxy, Vitamin B12, TSH, T4, Free  2. Urinary frequency Ua pos for hematuria only. CBG elevated but no glucose in urine and sample was NOT fasting.  Reassurance provided. Will send for urine micro. POCT urinalysis dipstick, Urinalysis, Routine w reflex microscopic  3. Routine  general medical examination at a health care facility   Reviewed preventive care protocols, scheduled due services, and updated immunizations Discussed nutrition, exercise, diet, and healthy lifestyle.  Pap

## 2011-10-01 LAB — VITAMIN D 25 HYDROXY (VIT D DEFICIENCY, FRACTURES): Vit D, 25-Hydroxy: 56 ng/mL (ref 30–89)

## 2011-10-05 ENCOUNTER — Encounter: Payer: Self-pay | Admitting: *Deleted

## 2011-10-05 ENCOUNTER — Encounter: Payer: Self-pay | Admitting: Family Medicine

## 2011-10-05 LAB — HM PAP SMEAR: HM Pap smear: NORMAL

## 2011-10-09 ENCOUNTER — Other Ambulatory Visit: Payer: Self-pay | Admitting: Family Medicine

## 2011-10-11 NOTE — Telephone Encounter (Signed)
Received refill request from pharmacy electronically. Last office visit 06/13. Is it okay to refill medication?

## 2011-10-29 ENCOUNTER — Telehealth: Payer: Self-pay | Admitting: Family Medicine

## 2011-10-29 NOTE — Telephone Encounter (Signed)
Caller: Vickie/Patient; PCP: Ruthe Mannan (Nest[facility] Ramp); CB#: [phone]; Pt calling today 10/29/11 regarding has pinkness to right eye.  Onset this AM.  No drainage, no tearing, no itching.  Afebrile.  Emergent symptoms r/o by Eye Infection or Irritation guidelines with exception of all other situations.  Home care advice given.

## 2011-11-25 ENCOUNTER — Other Ambulatory Visit: Payer: Self-pay | Admitting: Family Medicine

## 2011-11-25 DIAGNOSIS — R5381 Other malaise: Secondary | ICD-10-CM

## 2011-12-01 ENCOUNTER — Other Ambulatory Visit: Payer: 59

## 2011-12-16 ENCOUNTER — Ambulatory Visit (INDEPENDENT_AMBULATORY_CARE_PROVIDER_SITE_OTHER): Payer: 59 | Admitting: Family Medicine

## 2011-12-16 ENCOUNTER — Encounter: Payer: Self-pay | Admitting: Family Medicine

## 2011-12-16 VITALS — BP 110/66 | HR 84 | Temp 98.3°F | Wt 165.0 lb

## 2011-12-16 DIAGNOSIS — J019 Acute sinusitis, unspecified: Secondary | ICD-10-CM

## 2011-12-16 DIAGNOSIS — R110 Nausea: Secondary | ICD-10-CM

## 2011-12-16 MED ORDER — AMOXICILLIN-POT CLAVULANATE 875-125 MG PO TABS: 1.0000 | ORAL_TABLET | Freq: Two times a day (BID) | ORAL | Status: AC

## 2011-12-16 NOTE — Addendum Note (Signed)
Addended by: Josph Macho A on: 12/16/2011 09:53 AM   Modules accepted: Orders

## 2011-12-16 NOTE — Patient Instructions (Signed)
You have a sinus infection. Take medicine as prescribed:augmentin Push fluids and plenty of rest. Nasal saline irrigation or neti pot to help drain sinuses. May use simple mucinex with plenty of fluid to help mobilize mucous. Let us know if fever >101.5, trouble opening/closing mouth, difficulty swallowing, or worsening - you may need to be seen again.  

## 2011-12-16 NOTE — Assessment & Plan Note (Signed)
Anticipate bacterial sinusitis given duration and progression of sxs. Treat with augmentin and simple mucinex.   See pt instructions Update [place] if sxs worsening. Requests Upreg - will check.

## 2011-12-16 NOTE — Progress Notes (Signed)
  Subjective:    Patient ID: Sydney Walters, female    DOB: Sep 12, 1985, 26 y.o.   MRN: [id number]  HPI CC: sinusitis?  For last few weeks feeling more tired, nauseated.  Constant PNDrainage.  Last 7-10 days having sinus pressure, L ear discomfort and green mucous.  Mild tooth pain left lower jaw.  Sinus pressure headache.  Minimal cough.  Has not tried anything other than prescribed xyzal.  Also tried benadryl.  Has rescue inhaler, told to take singulair but hasn't been able to afford.  No fevers/chills, abd pain, ST.  No SOB, wheezing.  No smokers at home.  No sick contacts at home.  + asthma and allergies.  LMP last week - but worried could be pregnant - would like to have tested for peace of mind.  Review of Systems Per HPI    Objective:   Physical Exam  Nursing note and vitals reviewed. Constitutional: She appears well-developed and well-nourished. No distress.  HENT:  Head: Normocephalic and atraumatic.  Right Ear: Hearing, tympanic membrane, external ear and ear canal normal.  Left Ear: Hearing, tympanic membrane, external ear and ear canal normal.  Nose: No mucosal edema or rhinorrhea. Right sinus exhibits no maxillary sinus tenderness and no frontal sinus tenderness. Left sinus exhibits no maxillary sinus tenderness and no frontal sinus tenderness.  Mouth/Throat: Uvula is midline, oropharynx is clear and moist and mucous membranes are normal. No oropharyngeal exudate, posterior oropharyngeal edema, posterior oropharyngeal erythema or tonsillar abscesses.       Mild sinus pressure  Eyes: Conjunctivae and EOM are normal. Pupils are equal, round, and reactive to light. No scleral icterus.  Neck: Normal range of motion. Neck supple.  Cardiovascular: Normal rate, regular rhythm, normal heart sounds and intact distal pulses.   No murmur heard. Pulmonary/Chest: Effort normal and breath sounds normal. No respiratory distress. She has no wheezes. She has no rales.  Lymphadenopathy:      She has no cervical adenopathy.  Skin: Skin is warm and dry. No rash noted.       Assessment & Plan:

## 2012-06-12 ENCOUNTER — Telehealth: Payer: Self-pay | Admitting: Family Medicine

## 2012-06-12 NOTE — Telephone Encounter (Signed)
Patient Information:  Caller Name: Yanina  Phone: [phone]  Patient: Sydney Walters, Sydney Walters  Gender: Female  DOB: 09-28-1985  Age: 27 Years  PCP: Ruthe Mannan [facility])  Pregnant: No  Office Follow Up:  Does the office need to follow up with this patient?: No  Instructions For The Office: N/A  RN Note:  On BCP. Left hip pain and bruise after fell on ice in driveway at home.  Painful to walk or move; walks with a limp.  Denies pain spasms when relaxing. Denies internal or external rotation of foot.   Symptoms  Reason For Call & Symptoms: Slipped on ice in parking lot at home this morning approximately 0730 injuring L hip. Thumb-sized bruise present on Left buttocks  Reviewed Health History In EMR: Yes  Reviewed Medications In EMR: Yes  Reviewed Allergies In EMR: Yes  Reviewed Surgeries / Procedures: Yes  Date of Onset of Symptoms: 06/12/2012  Treatments Tried: Alleve  Treatments Tried Worked: Yes OB / GYN:  LMP: 05/25/2012  Guideline(s) Used:  Hip Injury  Disposition Per Guideline:   Home Care  Reason For Disposition Reached:   Minor hip injury  Advice Given:  Reassurance - Direct Blow (Contusion, Bruise)  A direct blow to your hip can cause a contusion. Contusion is the medical term for bruise.  Symptoms are mild pain, swelling, and/or bruising.  Here is some care advice that should help.  Apply a Cold Pack:  Apply a cold pack or an ice bag (wrapped in a moist towel) to the area for 20 minutes. Repeat in 1 hour, then every 4 hours while awake.  Continue this for the first 48 hours after an injury.  This will help decrease pain and swelling.  Apply Heat to the Area:  Beginning 48 hours after an injury, apply a warm washcloth or heating pad for 10 minutes three times a day.  This will help increase blood flow and improve healing.  Rest vs. Movement:  Movement is generally more healing in the long term than rest.  Continue normal activities as much as your pain  permits.  Avoid running and active sports for 1-2 weeks or until the pain and swelling are gone.  Expected Course:  Pain, swelling, and bruising usually start to get better 2 to 3 days after an injury.  Swelling most often is gone after 1 week.  Bruises fade away slowly over 1-2 weeks.  It may take 2 weeks for pain and tenderness of the injured area to go away.  Call Back If:  Pain becomes severe  Pain does not improve after 3 days  Pain or swelling lasts more than 2 weeks  You become worse.

## 2012-08-18 ENCOUNTER — Telehealth: Payer: Self-pay

## 2012-08-18 MED ORDER — NORGESTREL-ETHINYL ESTRADIOL 0.3-30 MG-MCG PO TABS: 1.0000 | ORAL_TABLET | Freq: Every day | ORAL | Status: DC

## 2012-08-18 NOTE — Telephone Encounter (Signed)
Pt left v/m; pt request Cryselle (does not know dosage) to [facility] [address]. Pt has run out of BC pill; was prescribed by GYN  At [facility] after miscarriage and since pt is not regular pt will not refill. Pt has CPX already scheduled 10/19/12.Please advise. Pt request call back.

## 2012-08-18 NOTE — Telephone Encounter (Signed)
Advised patient script has been sent to pharmacy.

## 2012-08-18 NOTE — Telephone Encounter (Signed)
Rx sent 

## 2012-10-19 ENCOUNTER — Encounter: Payer: 59 | Admitting: Family Medicine

## 2012-10-30 ENCOUNTER — Ambulatory Visit (INDEPENDENT_AMBULATORY_CARE_PROVIDER_SITE_OTHER): Payer: BC Managed Care – PPO | Admitting: Family Medicine

## 2012-10-30 ENCOUNTER — Other Ambulatory Visit (HOSPITAL_COMMUNITY)
Admission: RE | Admit: 2012-10-30 | Discharge: 2012-10-30 | Disposition: A | Discharge status: Home / Self Care | Payer: BC Managed Care – PPO | Source: Ambulatory Visit | Attending: Family Medicine | Admitting: Family Medicine

## 2012-10-30 ENCOUNTER — Encounter: Payer: Self-pay | Admitting: Family Medicine

## 2012-10-30 VITALS — BP 102/82 | HR 72 | Temp 98.0°F | Ht 65.0 in | Wt 154.0 lb

## 2012-10-30 DIAGNOSIS — Z01419 Encounter for gynecological examination (general) (routine) without abnormal findings: Secondary | ICD-10-CM | POA: Insufficient documentation

## 2012-10-30 DIAGNOSIS — Z136 Encounter for screening for cardiovascular disorders: Secondary | ICD-10-CM

## 2012-10-30 DIAGNOSIS — Z113 Encounter for screening for infections with a predominantly sexual mode of transmission: Secondary | ICD-10-CM | POA: Insufficient documentation

## 2012-10-30 DIAGNOSIS — R1030 Lower abdominal pain, unspecified: Secondary | ICD-10-CM

## 2012-10-30 DIAGNOSIS — F3289 Other specified depressive episodes: Secondary | ICD-10-CM

## 2012-10-30 DIAGNOSIS — Z0000 Encounter for general adult medical examination without abnormal findings: Secondary | ICD-10-CM

## 2012-10-30 DIAGNOSIS — R319 Hematuria, unspecified: Secondary | ICD-10-CM

## 2012-10-30 DIAGNOSIS — R109 Unspecified abdominal pain: Secondary | ICD-10-CM

## 2012-10-30 DIAGNOSIS — F329 Major depressive disorder, single episode, unspecified: Secondary | ICD-10-CM

## 2012-10-30 LAB — POCT URINALYSIS DIPSTICK
Bilirubin, UA: NEGATIVE
Ketones, UA: NEGATIVE
Leukocytes, UA: NEGATIVE
pH, UA: 6.5

## 2012-10-30 LAB — POCT URINE PREGNANCY: Preg Test, Ur: NEGATIVE

## 2012-10-30 MED ORDER — SERTRALINE HCL 25 MG PO TABS: ORAL_TABLET | ORAL | Status: DC

## 2012-10-30 MED ORDER — ETONOGESTREL-ETHINYL ESTRADIOL 0.12-0.015 MG/24HR VA RING: VAGINAL_RING | VAGINAL | Status: DC

## 2012-10-30 NOTE — Progress Notes (Signed)
27 yo here for CPX.  Adjustment disorder with depressive mood- followed by Wendall Mola (psych).  Has had a stressful year.  Separated from her husband in 03/2012 but both she and her son are adjusting well.  In fact, she stopped taking her Zoloft.  She does think perhaps she needs to restart it now.  More tearful and feels more anxious.     Well woman- had a history of abnormal pap smears several years ago. All have been normal since then. Denies any vaginal discharge, dysuria or other symptoms.  Normal pap smear done by me in June of last year, but she would like another pap smear today.  Wants to restart Nuvaring.  Having some spotting with OCPs and was on nuvaring in past.  Patient Active Problem List   Diagnosis Date Noted  . Routine general medical examination at a health care facility 09/15/2011  . Complete spontaneous abortion 07/23/2011  . DEPRESSION 05/19/2010  . ALLERGIC RHINITIS 05/19/2010  . ASTHMA 05/19/2010   Past Medical History  Diagnosis Date  . Asthma   . Depression     hx of med usuage, doing ok currently  . Abnormal Pap smear     bx and follow up were normal  . Hypoglycemia   . Allergy     seasonal  . Anxiety    Past Surgical History  Procedure Laterality Date  . Induced abortion    . Eye surgery  05/01/2010    lasik    History  Substance Use Topics  . Smoking status: Former Smoker    Quit date: 06/24/2008  . Smokeless tobacco: Never Used  . Alcohol Use: No   Family History  Problem Relation Age of Onset  . Coronary artery disease Other   . Thyroid disease Other   . Stroke Other   . Anesthesia problems Neg Hx   . Depression Mother   . Carpal tunnel syndrome Mother   . Anxiety disorder Mother   . Other      hypoglycemia  . Graves' disease Father   . Hypertension Father   . Mitral valve prolapse Father   . Heart disease Father     Congestive heart disease  . Diabetes Father    Allergies  Allergen Reactions  . Mushroom Extract Complex  Anaphylaxis   Current Outpatient Prescriptions on File Prior to Visit  Medication Sig Dispense Refill  . Blood Glucose Monitoring Suppl (BLOOD GLUCOSE MONITOR KIT) KIT (One Touch basic system w/device kit) use as directed       . glucose blood test strip 1 each. (One Touch test strips) use as directed       . levocetirizine (XYZAL) 5 MG tablet Take 5 mg by mouth every evening.      . Multiple Vitamins-Minerals (MULTIVITAMINS THER. W/MINERALS) TABS Take 1 tablet by mouth daily.        . norgestrel-ethinyl estradiol (LO/OVRAL) 0.3-30 MG-MCG tablet Take 1 tablet by mouth daily.  1 Package  11  . norgestrel-ethinyl estradiol (LO/OVRAL,CRYSELLE) 0.3-30 MG-MCG tablet Take 1 tablet by mouth daily.  1 Package  11  . Polyethyl Glycol-Propyl Glycol (SYSTANE) 0.4-0.3 % SOLN Apply 1 drop to eye daily as needed. Dryness       . sertraline (ZOLOFT) 25 MG tablet TAKE 1 TABLET BY MOUTH EVERY DAY  30 tablet  7  . [DISCONTINUED] misoprostol (CYTOTEC) 200 MCG tablet Take 1 tablet (200 mcg total) by mouth 4 (four) times daily.  4 tablet  0  No current facility-administered medications on file prior to visit.   The PMH, PSH, Social History, Family History, Medications, and allergies have been reviewed in [facility], and have been updated if relevant.  Review of Systems  See HPI  Patient reports no  vision/ hearing changes,anorexia, weight change, fever ,adenopathy, persistant / recurrent hoarseness, swallowing issues, chest pain, edema,persistant / recurrent cough, hemoptysis, dyspnea(rest, exertional, paroxysmal nocturnal), gastrointestinal  bleeding (melena, rectal bleeding), abdominal pain, excessive heart burn, GU symptoms(dysuria, hematuria, pyuria, voiding/incontinence  Issues) syncope, focal weakness, severe memory loss, concerning skin lesions, depression, anxiety, abnormal bruising/bleeding, major joint swelling, breast masses or abnormal vaginal bleeding.    Physical Exam  BP 102/82  Pulse 72  Temp(Src) 98  F (36.7 C)  Ht 5\' 5"  (1.651 m)  Wt 154 lb (69.854 kg)  BMI 25.63 kg/m2  General:  Well-developed,well-nourished,in no acute distress; alert,appropriate and cooperative throughout examination Head:  normocephalic and atraumatic.   Eyes:  vision grossly intact, pupils equal, pupils round, and pupils reactive to light.   Ears:  R ear normal and L ear normal.   Nose:  no external deformity.   Mouth:  good dentition.   Neck:  No deformities, masses, or tenderness noted. Breasts:  No mass, nodules, thickening, tenderness, bulging, retraction, inflamation, nipple discharge or skin changes noted.   Lungs:  Normal respiratory effort, chest expands symmetrically. Lungs are clear to auscultation, no crackles or wheezes. Heart:  Normal rate and regular rhythm. S1 and S2 normal without gallop, murmur, click, rub or other extra sounds. Abdomen:  Bowel sounds positive,abdomen soft and non-tender without masses, organomegaly or hernias noted. Rectal:  no external abnormalities.   Genitalia:  Pelvic Exam:        External: normal female genitalia without lesions or masses        Vagina: normal without lesions or masses        Cervix: normal without lesions or masses        Adnexa: normal bimanual exam without masses or fullness        Uterus: normal by palpation        Pap smear: performed Msk:  No deformity or scoliosis noted of thoracic or lumbar spine.   Extremities:  No clubbing, cyanosis, edema, or deformity noted with normal full range of motion of all joints.   Neurologic:  alert & oriented X3 and gait normal.   Skin:  Intact without suspicious lesions or rashes Cervical Nodes:  No lymphadenopathy noted Axillary Nodes:  No palpable lymphadenopathy Psych:  Cognition and judgment appear intact. Alert and cooperative with normal attention span and concentration. No apparent delusions, illusions, hallucinations  Assessment and Plan:  1. DEPRESSION Deteriorated.  Increased stressors.  Restart  Zoloft.  2. Routine general medical examination at a health care facility Reviewed preventive care protocols, scheduled due services, and updated immunizations Discussed nutrition, exercise, diet, and healthy lifestyle. -Pap smear - Comprehensive metabolic panel  3. Screening for STD (sexually transmitted disease)  - HIV Antibody - RPR  4. Screening for ischemic heart disease  - Lipid Panel

## 2012-10-30 NOTE — Patient Instructions (Addendum)
Great to see you. Please restart Zoloft 25 mg daily. Call me or email me in a few weeks with an update.  We are stopping your birth control pill and re starting nuva ring. Please let me know if spotting continues.

## 2012-10-31 ENCOUNTER — Encounter: Payer: Self-pay | Admitting: Family Medicine

## 2012-10-31 LAB — COMPREHENSIVE METABOLIC PANEL
ALT: 16 U/L (ref 0–35)
AST: 26 U/L (ref 0–37)
Albumin: 3.9 g/dL (ref 3.5–5.2)
Calcium: 9.2 mg/dL (ref 8.4–10.5)
Chloride: 105 mEq/L (ref 96–112)
Potassium: 3.7 mEq/L (ref 3.5–5.1)
Sodium: 139 mEq/L (ref 135–145)

## 2012-10-31 LAB — LIPID PANEL
Total CHOL/HDL Ratio: 5
VLDL: 26.2 mg/dL (ref 0.0–40.0)

## 2012-10-31 LAB — LDL CHOLESTEROL, DIRECT: Direct LDL: 167.4 mg/dL

## 2012-11-02 ENCOUNTER — Encounter: Payer: Self-pay | Admitting: Family Medicine

## 2012-11-09 ENCOUNTER — Ambulatory Visit (INDEPENDENT_AMBULATORY_CARE_PROVIDER_SITE_OTHER): Payer: BC Managed Care – PPO | Admitting: Family Medicine

## 2012-11-09 ENCOUNTER — Encounter: Payer: Self-pay | Admitting: Family Medicine

## 2012-11-09 VITALS — BP 108/70 | HR 72 | Temp 98.3°F | Wt 156.5 lb

## 2012-11-09 DIAGNOSIS — J020 Streptococcal pharyngitis: Secondary | ICD-10-CM | POA: Insufficient documentation

## 2012-11-09 DIAGNOSIS — J029 Acute pharyngitis, unspecified: Secondary | ICD-10-CM

## 2012-11-09 MED ORDER — AMOXICILLIN 875 MG PO TABS: 875.0000 mg | ORAL_TABLET | Freq: Two times a day (BID) | ORAL | Status: DC

## 2012-11-09 NOTE — Assessment & Plan Note (Signed)
4/4 centor criteria, ST strongly positive Treat with 10d amoxicillin. Ibuprofen 600mg  tid with food for sore throat. Pt agrees with plan. Red flags to return discussed. Work excuse provided.

## 2012-11-09 NOTE — Progress Notes (Signed)
  Subjective:    Patient ID: Sydney Walters, female    DOB: 09-Apr-1986, 27 y.o.   MRN: [id number]  HPI CC: ST, fever  1d h/o ST, associated with fever up to 101.5.  Swollen glands noted as well.  Last night noticed white spots on back.  + congestion, HA.  Body aches.  L earache started yesterday as well.  No coughing, abd pain or nausea, tooth pain.  No new rashes.  No sick contacts.  H/o ashtma and allergies - takes xyzal.  No recent asthma flares.  Taking ibuprofen for this. H/o strep in past, >10 yrs ago.  Past Medical History  Diagnosis Date  . Asthma   . Depression     hx of med usuage, doing ok currently  . Abnormal Pap smear     bx and follow up were normal  . Hypoglycemia   . Allergy     seasonal  . Anxiety      Review of Systems Per HPI    Objective:   Physical Exam  Nursing note and vitals reviewed. Constitutional: She appears well-developed and well-nourished. No distress.  HENT:  Head: Normocephalic and atraumatic.  Right Ear: Hearing, tympanic membrane, external ear and ear canal normal.  Left Ear: Hearing, tympanic membrane, external ear and ear canal normal.  Nose: No mucosal edema or rhinorrhea. Right sinus exhibits no maxillary sinus tenderness and no frontal sinus tenderness. Left sinus exhibits no maxillary sinus tenderness and no frontal sinus tenderness.  Mouth/Throat: Uvula is midline and mucous membranes are normal. Posterior oropharyngeal edema and posterior oropharyngeal erythema present. No oropharyngeal exudate or tonsillar abscesses.  Beefy red tonsils bilaterally, significant bilateral tonsillar hypertrophy with exudate and pharyngeal exudate halitosis  Eyes: Conjunctivae and EOM are normal. Pupils are equal, round, and reactive to light. No scleral icterus.  Neck: Normal range of motion. Neck supple.  Cardiovascular: Normal rate, regular rhythm, normal heart sounds and intact distal pulses.   No murmur heard. Pulmonary/Chest: Effort normal  and breath sounds normal. No respiratory distress. She has no wheezes. She has no rales.  Lymphadenopathy:    She has cervical adenopathy (bilateral small LAD).  Skin: Skin is warm and dry. No rash noted.       Assessment & Plan:

## 2012-11-09 NOTE — Patient Instructions (Signed)
2nd form of birth control while on antibiotics  Strep Throat Strep throat is an infection of the throat caused by a bacteria named Streptococcus pyogenes. Your caregiver may call the infection streptococcal "tonsillitis" or "pharyngitis" depending on whether there are signs of inflammation in the tonsils or back of the throat. Strep throat is most common in children aged 27 15 years during the cold months of the year, but it can occur in people of any age during any season. This infection is spread from person to person (contagious) through coughing, sneezing, or other close contact. SYMPTOMS   Fever or chills.  Painful, swollen, red tonsils or throat.  Pain or difficulty when swallowing.  White or yellow spots on the tonsils or throat.  Swollen, tender lymph nodes or "glands" of the neck or under the jaw.  Red rash all over the body (rare). DIAGNOSIS  Many different infections can cause the same symptoms. A test must be done to confirm the diagnosis so the right treatment can be given. A "rapid strep test" can help your caregiver make the diagnosis in a few minutes. If this test is not available, a light swab of the infected area can be used for a throat culture test. If a throat culture test is done, results are usually available in a day or two. TREATMENT  Strep throat is treated with antibiotic medicine. HOME CARE INSTRUCTIONS   Gargle with 1 tsp of salt in 1 cup of warm water, 3 4 times per day or as needed for comfort.  Family members who also have a sore throat or fever should be tested for strep throat and treated with antibiotics if they have the strep infection.  Make sure everyone in your household washes their hands well.  Do not share food, drinking cups, or personal items that could cause the infection to spread to others.  You may need to eat a soft food diet until your sore throat gets better.  Drink enough water and fluids to keep your urine clear or pale yellow. This  will help prevent dehydration.  Get plenty of rest.  Stay home from school, daycare, or work until you have been on antibiotics for 24 hours.  Only take over-the-counter or prescription medicines for pain, discomfort, or fever as directed by your caregiver.  If antibiotics are prescribed, take them as directed. Finish them even if you start to feel better. SEEK MEDICAL CARE IF:   The glands in your neck continue to enlarge.  You develop a rash, cough, or earache.  You cough up green, yellow-brown, or bloody sputum.  You have pain or discomfort not controlled by medicines.  Your problems seem to be getting worse rather than better. SEEK IMMEDIATE MEDICAL CARE IF:   You develop any new symptoms such as vomiting, severe headache, stiff or painful neck, chest pain, shortness of breath, or trouble swallowing.  You develop severe throat pain, drooling, or changes in your voice.  You develop swelling of the neck, or the skin on the neck becomes red and tender.  You have a fever.  You develop signs of dehydration, such as fatigue, dry mouth, and decreased urination.  You become increasingly sleepy, or you cannot wake up completely. Document Released: 04/09/2000 Document Revised: 03/29/2012 Document Reviewed: 06/11/2010 [facility] Patient Information 2014 [place], [place].

## 2012-12-14 ENCOUNTER — Encounter: Payer: Self-pay | Admitting: Family Medicine

## 2013-02-01 ENCOUNTER — Ambulatory Visit (INDEPENDENT_AMBULATORY_CARE_PROVIDER_SITE_OTHER): Payer: BC Managed Care – PPO | Admitting: Family Medicine

## 2013-02-01 ENCOUNTER — Encounter: Payer: Self-pay | Admitting: Family Medicine

## 2013-02-01 VITALS — BP 110/70 | HR 97 | Temp 97.7°F | Wt 154.0 lb

## 2013-02-01 DIAGNOSIS — J020 Streptococcal pharyngitis: Secondary | ICD-10-CM

## 2013-02-01 DIAGNOSIS — J029 Acute pharyngitis, unspecified: Secondary | ICD-10-CM

## 2013-02-01 LAB — POCT RAPID STREP A (OFFICE): Rapid Strep A Screen: NEGATIVE

## 2013-02-01 MED ORDER — LEVOCETIRIZINE DIHYDROCHLORIDE 5 MG PO TABS: 5.0000 mg | ORAL_TABLET | Freq: Every evening | ORAL | Status: DC

## 2013-02-01 NOTE — Addendum Note (Signed)
Addended by: Sueanne Margarita on: 02/01/2013 10:39 AM   Modules accepted: Orders

## 2013-02-01 NOTE — Progress Notes (Signed)
SUBJECTIVE: 27 y.o. female with sore throat, myalgias, swollen glands, for 3 days. No fever. No history of rheumatic fever. Other symptoms: congestion but does have h/o allergic rhinitis.  Has been out of her Xyzal. No known sick contacts.  Patient Active Problem List   Diagnosis Date Noted  . Strep pharyngitis 11/09/2012  . Routine general medical examination at a health care facility 09/15/2011  . Complete spontaneous abortion 07/23/2011  . DEPRESSION 05/19/2010  . ALLERGIC RHINITIS 05/19/2010  . ASTHMA 05/19/2010   Past Medical History  Diagnosis Date  . Asthma   . Depression     hx of med usuage, doing ok currently  . Abnormal Pap smear     bx and follow up were normal  . Hypoglycemia   . Allergy     seasonal  . Anxiety    Past Surgical History  Procedure Laterality Date  . Induced abortion    . Eye surgery  05/01/2010    lasik    History  Substance Use Topics  . Smoking status: Former Smoker    Quit date: 06/24/2008  . Smokeless tobacco: Never Used  . Alcohol Use: No   Family History  Problem Relation Age of Onset  . Coronary artery disease Other   . Thyroid disease Other   . Stroke Other   . Anesthesia problems Neg Hx   . Depression Mother   . Carpal tunnel syndrome Mother   . Anxiety disorder Mother   . Other      hypoglycemia  . Graves' disease Father   . Hypertension Father   . Mitral valve prolapse Father   . Heart disease Father     Congestive heart disease  . Diabetes Father    Allergies  Allergen Reactions  . Mushroom Extract Complex Anaphylaxis   Current Outpatient Prescriptions on File Prior to Visit  Medication Sig Dispense Refill  . etonogestrel-ethinyl estradiol (NUVARING) 0.12-0.015 MG/24HR vaginal ring INSERT 1 RING EVERY 3 WEEKS  1 each  12  . glucose blood test strip 1 each. (One Touch test strips) use as directed       . Multiple Vitamins-Minerals (MULTIVITAMINS THER. W/MINERALS) TABS Take 1 tablet by mouth daily.        Bertram Gala Glycol-Propyl Glycol (SYSTANE) 0.4-0.3 % SOLN Apply 1 drop to eye daily as needed. Dryness       . sertraline (ZOLOFT) 25 MG tablet TAKE 1 TABLET BY MOUTH EVERY DAY  30 tablet  7  . [DISCONTINUED] misoprostol (CYTOTEC) 200 MCG tablet Take 1 tablet (200 mcg total) by mouth 4 (four) times daily.  4 tablet  0   No current facility-administered medications on file prior to visit.   The PMH, PSH, Social History, Family History, Medications, and allergies have been reviewed in [facility], and have been updated if relevant.  OBJECTIVE:  BP 110/70  Pulse 97  Temp(Src) 97.7 F (36.5 C) (Tympanic)  Wt 154 lb (69.854 kg)  BMI 25.63 kg/m2  SpO2 97%  Appears alert, well appearing, and in no distress. Ears: bilateral TM's and external ear canals normal Oropharynx: mucous membranes moist, pharynx normal without lesions and erythematous Neck: supple, no significant adenopathy Lungs: clear to auscultation, no wheezes, rales or rhonchi, symmetric air entry Rapid Strep test is negative  ASSESSMENT: Viral pharyngitis  PLAN: Per orders. Gargle, use acetaminophen or other OTC analgesic, see AVS. See prn.

## 2013-02-01 NOTE — Patient Instructions (Signed)
Good to see you. This is likely allergic or viral but we will call you with your confirmatory throat culture results. I have refilled your xyzal.

## 2013-02-03 LAB — CULTURE, GROUP A STREP

## 2013-03-01 ENCOUNTER — Other Ambulatory Visit: Payer: Self-pay

## 2013-04-26 NOTE — L&D Delivery Note (Signed)
Delivery Note At 5:38 AM a viable female was delivered via Vaginal, Spontaneous Delivery (Presentation: Left Occiput Anterior).  APGAR: , ; weight .   Placenta status: Intact, Spontaneous by Dr. Clearance CootsHarper.  Cord: 3 vessels with the following complications: 30 second shoulder dystocia, McRoberts, Suprapubic pressure, unable to perform woods corkscrew, posteriorly compound hand delivered first with posterior shoulder.  Dr. Jolayne Pantheronstant present for delivery of baby.   Anesthesia: None  Episiotomy: None Lacerations: None Suture Repair: n/a Est. Blood Loss (mL): 350  Mom to postpartum.  Baby to warmer initially, then to mom for skin to skin.  ACOSTA,KRISTY ROCIO 01/23/2014, 6:01 AM

## 2013-07-02 ENCOUNTER — Encounter: Payer: Self-pay | Admitting: Family Medicine

## 2013-07-02 ENCOUNTER — Ambulatory Visit (INDEPENDENT_AMBULATORY_CARE_PROVIDER_SITE_OTHER): Payer: No Typology Code available for payment source | Admitting: Family Medicine

## 2013-07-02 VITALS — BP 110/64 | HR 107 | Temp 97.9°F | Ht 64.75 in | Wt 181.0 lb

## 2013-07-02 DIAGNOSIS — Z3201 Encounter for pregnancy test, result positive: Secondary | ICD-10-CM

## 2013-07-02 DIAGNOSIS — R635 Abnormal weight gain: Secondary | ICD-10-CM

## 2013-07-02 LAB — POCT URINE PREGNANCY: Preg Test, Ur: POSITIVE

## 2013-07-02 NOTE — Progress Notes (Signed)
Pre visit review using our clinic review tool, if applicable. No additional management support is needed unless otherwise documented below in the visit note. 

## 2013-07-02 NOTE — Assessment & Plan Note (Signed)
Ad[facility] that [facility]- blood sugar, thyroid, etc. Also not advised to diet during pregnancy- eat right and exercise. The patient indicates understanding of these issues and agrees with the plan.

## 2013-07-02 NOTE — Patient Instructions (Signed)
Good to see you. Please stop by to see Shirlee LimerickMarion on your way out to set up your referral.  Keep taking your prenatal vitamin with iron (daily).

## 2013-07-02 NOTE — Assessment & Plan Note (Signed)
9 weeks and 4 days based on dates. Continue PNV. Refer to OB. The patient indicates understanding of these issues and agrees with the plan.

## 2013-07-02 NOTE — Progress Notes (Signed)
Subjective:   Patient ID: Sydney Walters, female    DOB: 11-08-1985, 28 y.o.   MRN: [id number]  Sydney Walters is a pleasant 28 y.o. year old female who presents to clinic today with Weight Gain  on 07/02/2013  HPI: LMP 04/28/2013!  Has had no spotting.  + nausea, no vomiting.  +breast tenderness.  She is taking a PNV daily.  She has been fatigued.   Noticed she was gaining weight before her LMP.  Wt Readings from Last 3 Encounters:  07/02/13 181 lb (82.101 kg)  02/01/13 154 lb (69.854 kg)  11/09/12 156 lb 8 oz (70.988 kg)   Patient Active Problem List   Diagnosis Date Noted  . Weight gain 07/02/2013  . Strep pharyngitis 11/09/2012  . Routine general medical examination at a health care facility 09/15/2011  . Complete spontaneous abortion 07/23/2011  . DEPRESSION 05/19/2010  . ALLERGIC RHINITIS 05/19/2010  . ASTHMA 05/19/2010   Past Medical History  Diagnosis Date  . Asthma   . Depression     hx of med usuage, doing ok currently  . Abnormal Pap smear     bx and follow up were normal  . Hypoglycemia   . Allergy     seasonal  . Anxiety    Past Surgical History  Procedure Laterality Date  . Induced abortion    . Eye surgery  05/01/2010    lasik    History  Substance Use Topics  . Smoking status: Former Smoker    Quit date: 06/24/2008  . Smokeless tobacco: Never Used  . Alcohol Use: No   Family History  Problem Relation Age of Onset  . Coronary artery disease Other   . Thyroid disease Other   . Stroke Other   . Anesthesia problems Neg Hx   . Depression Mother   . Carpal tunnel syndrome Mother   . Anxiety disorder Mother   . Other      hypoglycemia  . Graves' disease Father   . Hypertension Father   . Mitral valve prolapse Father   . Heart disease Father     Congestive heart disease  . Diabetes Father    Allergies  Allergen Reactions  . Mushroom Extract Complex Anaphylaxis   Current Outpatient Prescriptions on File Prior to Visit  Medication  Sig Dispense Refill  . etonogestrel-ethinyl estradiol (NUVARING) 0.12-0.015 MG/24HR vaginal ring INSERT 1 RING EVERY 3 WEEKS  1 each  12  . glucose blood test strip 1 each. (One Touch test strips) use as directed       . levocetirizine (XYZAL) 5 MG tablet Take 1 tablet (5 mg total) by mouth every evening.  30 tablet  3  . Multiple Vitamins-Minerals (MULTIVITAMINS THER. W/MINERALS) TABS Take 1 tablet by mouth daily.        Bertram Gala Glycol-Propyl Glycol (SYSTANE) 0.4-0.3 % SOLN Apply 1 drop to eye daily as needed. Dryness       . sertraline (ZOLOFT) 25 MG tablet TAKE 1 TABLET BY MOUTH EVERY DAY  30 tablet  7  . [DISCONTINUED] misoprostol (CYTOTEC) 200 MCG tablet Take 1 tablet (200 mcg total) by mouth 4 (four) times daily.  4 tablet  0   No current facility-administered medications on file prior to visit.   The PMH, PSH, Social History, Family History, Medications, and allergies have been reviewed in [facility], and have been updated if relevant.   Review of Systems See HPI    Objective:    BP 110/64  Pulse 107  Temp(Src) 97.9 F (36.6 C) (Oral)  Ht 5' 4.75" (1.645 m)  Wt 181 lb (82.101 kg)  BMI 30.34 kg/m2  SpO2 97%  LMP 04/28/2013   Physical Exam  Gen:  Alert, pleasant, NAD Psych:  Tearful but appropriate Good eye contact.      Assessment & Plan:   Weight gain - Plan: POCT urine pregnancy  Pregnancy test positive - Plan: Ambulatory referral to Obstetrics / Gynecology No Follow-up on file.

## 2013-08-22 ENCOUNTER — Encounter: Payer: Self-pay | Admitting: Obstetrics & Gynecology

## 2013-08-22 ENCOUNTER — Ambulatory Visit (INDEPENDENT_AMBULATORY_CARE_PROVIDER_SITE_OTHER): Payer: No Typology Code available for payment source | Admitting: Obstetrics & Gynecology

## 2013-08-22 VITALS — BP 124/84 | HR 90 | Temp 98.4°F | Wt 181.0 lb

## 2013-08-22 DIAGNOSIS — Z3480 Encounter for supervision of other normal pregnancy, unspecified trimester: Secondary | ICD-10-CM

## 2013-08-22 DIAGNOSIS — O365990 Maternal care for other known or suspected poor fetal growth, unspecified trimester, not applicable or unspecified: Secondary | ICD-10-CM

## 2013-08-22 DIAGNOSIS — Z131 Encounter for screening for diabetes mellitus: Secondary | ICD-10-CM

## 2013-08-22 LAB — POCT URINALYSIS DIPSTICK
Bilirubin, UA: NEGATIVE
Blood, UA: NEGATIVE
GLUCOSE UA: NEGATIVE
Ketones, UA: NEGATIVE
NITRITE UA: NEGATIVE
PROTEIN UA: NEGATIVE
Spec Grav, UA: 1.02
UROBILINOGEN UA: NEGATIVE
pH, UA: 5

## 2013-08-22 LAB — OB RESULTS CONSOLE GC/CHLAMYDIA
CHLAMYDIA, DNA PROBE: NEGATIVE
GC PROBE AMP, GENITAL: NEGATIVE

## 2013-08-22 MED ORDER — MONTELUKAST SODIUM 10 MG PO TABS: 10.0000 mg | ORAL_TABLET | Freq: Every day | ORAL | Status: AC

## 2013-08-22 NOTE — Progress Notes (Signed)
Subjective:    Sydney Walters is being seen today for her first obstetrical visit.  This is not a planned pregnancy. She is at 2979w4d gestation. Her obstetrical history is significant for none. Relationship with FOB: significant other, living together. Patient does intend to breast feed. Pregnancy history fully reviewed.    Menstrual History: OB History   Grav Para Term Preterm Abortions TAB SAB Ect Mult Living   4 1 1  0 2 1 1  0 0 1       Patient's last menstrual period was 04/28/2013.    Past Medical History  Diagnosis Date  . Asthma   . Depression     hx of med usuage, doing ok currently  . Abnormal Pap smear     bx and follow up were normal  . Hypoglycemia   . Allergy     seasonal  . Anxiety   . IBS (irritable bowel syndrome)     Past Surgical History  Procedure Laterality Date  . Induced abortion    . Eye surgery  05/01/2010    lasik      (Not in a hospital admission) Allergies  Allergen Reactions  . Mushroom Extract Complex Anaphylaxis    History  Substance Use Topics  . Smoking status: Former Smoker    Quit date: 06/24/2008  . Smokeless tobacco: Never Used  . Alcohol Use: No    Family History  Problem Relation Age of Onset  . Coronary artery disease Other   . Thyroid disease Other   . Stroke Other   . Anesthesia problems Neg Hx   . Depression Mother   . Carpal tunnel syndrome Mother   . Anxiety disorder Mother   . Other      hypoglycemia  . Graves' disease Father   . Hypertension Father   . Mitral valve prolapse Father   . Heart disease Father     Congestive heart disease  . Diabetes Father      Review of Systems Constitutional: negative for weight loss Gastrointestinal: negative for vomiting Genitourinary:negative for genital lesions and vaginal discharge and dysuria; positive for itching Musculoskeletal:negative for back pain Behavioral/Psych: negative for abusive relationship, depression, illegal drug usage and tobacco use     Objective:     General Appearance:    Alert, cooperative, no distress, appears stated age  Head:    Normocephalic, without obvious abnormality, atraumatic  Eyes:    PERRL, conjunctiva/corneas clear, EOM's intact, fundi    benign, both eyes  Ears:    Normal TM's and external ear canals, both ears  Nose:   Nares normal, septum midline, mucosa normal, no drainage    or sinus tenderness  Throat:   Lips, mucosa, and tongue normal; teeth and gums normal  Neck:   Supple, symmetrical, trachea midline, no adenopathy;    thyroid:  no enlargement/tenderness/nodules; no carotid   bruit or JVD  Back:     Symmetric, no curvature, ROM normal, no CVA tenderness  Lungs:     Clear to auscultation bilaterally, respirations unlabored  Chest Wall:    No tenderness or deformity   Heart:    Regular rate and rhythm, S1 and S2 normal, no murmur, rub   or gallop  Breast Exam:    No tenderness, masses, or nipple abnormality  Abdomen:     Soft, non-tender, bowel sounds active all four quadrants,    no masses, no organomegaly  Genitalia:    Normal female without lesion, tenderness; curd-like vaginal discharge  Extremities:  Extremities normal, atraumatic, no cyanosis or edema  Pulses:   2+ and symmetric all extremities  Skin:   Skin color, texture, turgor normal, no rashes or lesions  Lymph nodes:   Cervical, supraclavicular, and axillary nodes normal  Neurologic:   CNII-XII intact, normal strength, sensation and reflexes    throughout      Lab Review Urine pregnancy test Labs reviewed no Radiologic studies reviewed no Assessment:    Pregnancy at 16w448d weeks  Likely candida vulvovaginitis   Plan:      Prenatal vitamins.  Counseling provided regarding continued use of seat belts, cessation of alcohol consumption, smoking or use of illicit drugs; infection precautions i.e., influenza/TDAP immunizations, toxoplasmosis,CMV, parvovirus, listeria and varicella; workplace safety, exercise during pregnancy;  routine dental care, safe medications, sexual activity, hot tubs, saunas, pools, travel, caffeine use, fish and methlymercury, potential toxins, hair treatments, varicose veins Weight gain recommendations per IOM guidelines reviewed:  obese/BMI >30->gain  11 - 20 lbs Problem list reviewed and updated. CF mutation testing/QUAD SCREEN/fragile X/Spinal muscular atrophy discussed. Role of ultrasound in pregnancy discussed; fetal survey: ordered. Amniocentesis discussed: not indicated. VBAC calculator score: VBAC consent form provided Meds ordered this encounter  Medications  . Prenatal Multivit-Min-Fe-FA (PRENATAL VITAMINS PO)    Sig: Take 1 tablet by mouth.  . cetirizine (ZYRTEC) 10 MG tablet    Sig: Take 10 mg by mouth daily.  . montelukast (SINGULAIR) 10 MG tablet    Sig: Take 1 tablet (10 mg total) by mouth at bedtime.    Dispense:  30 tablet    Refill:  11   Orders Placed This Encounter  Procedures  . Culture, OB Urine  . GC/Chlamydia Probe Amp  . US OB Comp + 14 Wk    Standing Status: Future     Number of Occurrences:      Standing Expiration Date: 10/23/2014    Order Specific Question:  Reason for Exam (SYMPTOM  OR DIAGNOSIS REQUIRED)    Answer:  656.53    Order Specific Question:  Preferred imaging location?    Answer:  Internal  . Obstetric panel  . HIV antibody  . Varicella zoster antibody, IgG  . Vit D  25 hydroxy (rtn osteoporosis monitoring)  . Hemoglobin A1c  . POCT urinalysis dipstick    Follow up in 4 weeks.

## 2013-08-23 ENCOUNTER — Other Ambulatory Visit: Payer: Self-pay | Admitting: *Deleted

## 2013-08-23 DIAGNOSIS — B379 Candidiasis, unspecified: Secondary | ICD-10-CM

## 2013-08-23 LAB — OBSTETRIC PANEL
ANTIBODY SCREEN: NEGATIVE
Basophils Absolute: 0 10*3/uL (ref 0.0–0.1)
Basophils Relative: 0 % (ref 0–1)
EOS ABS: 0.1 10*3/uL (ref 0.0–0.7)
EOS PCT: 1 % (ref 0–5)
HEMATOCRIT: 35.3 % — AB (ref 36.0–46.0)
Hemoglobin: 12.3 g/dL (ref 12.0–15.0)
Hepatitis B Surface Ag: NEGATIVE
LYMPHS ABS: 2.1 10*3/uL (ref 0.7–4.0)
LYMPHS PCT: 24 % (ref 12–46)
MCH: 28.9 pg (ref 26.0–34.0)
MCHC: 34.8 g/dL (ref 30.0–36.0)
MCV: 82.9 fL (ref 78.0–100.0)
MONO ABS: 0.7 10*3/uL (ref 0.1–1.0)
MONOS PCT: 8 % (ref 3–12)
Neutro Abs: 5.8 10*3/uL (ref 1.7–7.7)
Neutrophils Relative %: 67 % (ref 43–77)
Platelets: 285 10*3/uL (ref 150–400)
RBC: 4.26 MIL/uL (ref 3.87–5.11)
RDW: 13.1 % (ref 11.5–15.5)
RH TYPE: POSITIVE
RUBELLA: 0.4 {index} (ref <0.90)
WBC: 8.7 10*3/uL (ref 4.0–10.5)

## 2013-08-23 LAB — HIV ANTIBODY (ROUTINE TESTING W REFLEX): HIV: NONREACTIVE

## 2013-08-23 LAB — VITAMIN D 25 HYDROXY (VIT D DEFICIENCY, FRACTURES): VIT D 25 HYDROXY: 53 ng/mL (ref 30–89)

## 2013-08-23 LAB — GC/CHLAMYDIA PROBE AMP
CT PROBE, AMP APTIMA: NEGATIVE
GC PROBE AMP APTIMA: NEGATIVE

## 2013-08-23 LAB — VARICELLA ZOSTER ANTIBODY, IGG: Varicella IgG: 837.9 Index — ABNORMAL HIGH (ref <135.00)

## 2013-08-23 LAB — CULTURE, OB URINE
COLONY COUNT: NO GROWTH
Organism ID, Bacteria: NO GROWTH

## 2013-08-23 MED ORDER — TERCONAZOLE 0.4 % VA CREA: 1.0000 | TOPICAL_CREAM | Freq: Every day | VAGINAL | Status: DC

## 2013-08-23 NOTE — Patient Instructions (Signed)
Prenatal Care  °WHAT IS PRENATAL CARE?  °Prenatal care means health care during your pregnancy, before your baby is born. It is very important to take care of yourself and your baby during your pregnancy by:  °· Getting early prenatal care. If you know you are pregnant, or think you might be pregnant, call your health care provider as soon as possible. Schedule a visit for a prenatal exam. °· Getting regular prenatal care. Follow your health care provider's schedule for blood and other necessary tests. Do not miss appointments. °· Doing everything you can to keep yourself and your baby healthy during your pregnancy. °· Getting complete care. Prenatal care should include evaluation of the medical, dietary, educational, psychological, and social needs of you and your significant other. The medical and genetic history of your family and the family of your baby's father should be discussed with your health care provider. °· Discussing with your health care provider: °· Prescription, over-the-counter, and herbal medicines that you take. °· Any history of substance abuse, alcohol use, smoking, and illegal drug use. °· Any history of domestic abuse and violence. °· Immunizations you have received. °· Your nutrition and diet. °· The amount of exercise you do. °· Any environmental and occupational hazards to which you are exposed. °· History of sexually transmitted infections for both you and your partner. °· Previous pregnancies you have had. °WHY IS PRENATAL CARE SO IMPORTANT?  °By regularly seeing your health care provider, you help ensure that problems can be identified early so that they can be treated as soon as possible. Other problems might be prevented. Many studies have shown that early and regular prenatal care is important for the health of mothers and their babies.  °HOW CAN I TAKE CARE OF MYSELF WHILE I AM PREGNANT?  °Here are ways to take care of yourself and your baby:  °· Start or continue taking your  multivitamin with 400 micrograms (mcg) of folic acid every day. °· Get early and regular prenatal care. It is very important to see a health care provider during your pregnancy. Your health care provider will check at each visit to make sure that you and the baby are healthy. If there are any problems, action can be taken right away to help you and the baby. °· Eat a healthy diet that includes: °· Fruits. °· Vegetables. °· Foods low in saturated fat. °· Whole grains. °· Calcium-rich foods, such as milk, yogurt, and hard cheeses. °· Drink 6 to 8 glasses of liquids a day. °· Unless your health care provider tells you not to, try to be physically active for 30 minutes, most days of the week. If you are pressed for time, you can get your activity in through 10-minute segments, three times a day. °· Do not smoke, drink alcohol, or use drugs. These can cause long-term damage to your baby. Talk with your health care provider about steps to take to stop smoking. Talk with a member of your faith community, a counselor, a trusted friend, or your health care provider if you are concerned about your alcohol or drug use. °· Ask your health care provider before taking any medicine, even over-the-counter medicines. Some medicines are not safe to take during pregnancy. °· Get plenty of rest and sleep. °· Avoid hot tubs and saunas during pregnancy. °· Do not have X-rays taken unless absolutely necessary and with the recommendation of your health care provider. A lead shield can be placed on your abdomen to protect the   baby when X-rays are taken in other parts of the body. °· Do not empty the cat litter when you are pregnant. It may contain a parasite that causes an infection called toxoplasmosis, which can cause birth defects. Also, use gloves when working in garden areas used by cats. °· Do not eat uncooked or undercooked meats or fish. °· Do not eat soft, mold-ripened cheeses (Brie, Camembert, and chevre) or soft, blue-veined  cheese (Danish blue and Roquefort). °· Stay away from toxic chemicals like: °· Insecticides. °· Solvents (some cleaners or paint thinners). °· Lead. °· Mercury. °· Sexual intercourse may continue until the end of the pregnancy, unless you have a medical problem or there is a problem with the pregnancy and your health care provider tells you not to. °· Do not wear high-heel shoes, especially during the second half of the pregnancy. You can lose your balance and fall. °· Do not take long trips, unless absolutely necessary. Be sure to see your health care provider before going on the trip. °· Do not sit in one position for more than 2 hours when on a trip. °· Take a copy of your medical records when going on a trip. Know where a hospital is located in the city you are visiting, in case of an emergency. °· Most dangerous household products will have pregnancy warnings on their labels. Ask your health care provider about products if you are unsure. °· Limit or eliminate your caffeine intake from coffee, tea, sodas, medicines, and chocolate. °· Many women continue working through pregnancy. Staying active might help you stay healthier. If you have a question about the safety or the hours you work at your particular job, talk with your health care provider. °· Get informed: °· Read books. °· Watch videos. °· Go to childbirth classes for you and your significant other. °· Talk with experienced moms. °· Ask your health care provider about childbirth education classes for you and your partner. Classes can help you and your partner prepare for the birth of your baby. °· Ask about a baby doctor (pediatrician) and methods and pain medicine for labor, delivery, and possible cesarean delivery. °HOW OFTEN SHOULD I SEE MY HEALTH CARE PROVIDER DURING PREGNANCY?  °Your health care provider will give you a schedule for your prenatal visits. You will have visits more often as you get closer to the end of your pregnancy. An average  pregnancy lasts about 40 weeks.  °A typical schedule includes visiting your health care provider:  °· About once each month during your first 6 months of pregnancy. °· Every 2 weeks during the next 2 months. °· Weekly in the last month, until the delivery date. °Your health care provider will probably want to see you more often if: °· You are older than 35 years. °· Your pregnancy is high risk because you have certain health problems or problems with the pregnancy, such as: °· Diabetes. °· High blood pressure. °· The baby is not growing on schedule, according to the dates of the pregnancy. °Your health care provider will do special tests to make sure you and the baby are not having any serious problems. °WHAT HAPPENS DURING PRENATAL VISITS?  °· At your first prenatal visit, your health care provider will do a physical exam and talk to you about your health history and the health history of your partner and your family. Your health care provider will be able to tell you what date to expect your baby to be born on. °·   Your first physical exam will include checks of your blood pressure, measurements of your height and weight, and an exam of your pelvic organs. Your health care provider will do a Pap test if you have not had one recently and will do cultures of your cervix to make sure there is no infection. °· At each prenatal visit, there will be tests of your blood, urine, blood pressure, weight, and checking the progress of the baby. °· At your later prenatal visits, your health care provider will check how you are doing and how the baby is developing. You may have a number of tests done as your pregnancy progresses. °· Ultrasound exams are often used to check on the baby's growth and health. °· You may have more urine and blood tests, as well as special tests, if needed. These may include amniocentesis to examine fluid in the pregnancy sac, stress tests to check how the baby responds to contractions, or a  biophysical profile to measure fetus well-being. Your health care provider will explain the tests and why they are necessary. °· You should discuss with your health care provider your plans to breastfeed or bottle-feed your baby. °· Each visit is also a chance for you to learn about staying healthy during pregnancy and to ask questions. °Document Released: 04/15/2003 Document Revised: 01/31/2013 Document Reviewed: 09/28/2012 °ExitCare® Patient Information ©2014 [employer]. ° °

## 2013-08-27 ENCOUNTER — Encounter: Payer: Self-pay | Admitting: Obstetrics & Gynecology

## 2013-09-04 ENCOUNTER — Ambulatory Visit (INDEPENDENT_AMBULATORY_CARE_PROVIDER_SITE_OTHER): Payer: No Typology Code available for payment source | Admitting: Advanced Practice Midwife

## 2013-09-04 ENCOUNTER — Ambulatory Visit (INDEPENDENT_AMBULATORY_CARE_PROVIDER_SITE_OTHER): Payer: No Typology Code available for payment source

## 2013-09-04 ENCOUNTER — Encounter: Payer: Self-pay | Admitting: Obstetrics & Gynecology

## 2013-09-04 DIAGNOSIS — O365990 Maternal care for other known or suspected poor fetal growth, unspecified trimester, not applicable or unspecified: Secondary | ICD-10-CM

## 2013-09-04 DIAGNOSIS — Z3480 Encounter for supervision of other normal pregnancy, unspecified trimester: Secondary | ICD-10-CM

## 2013-09-04 LAB — POCT URINALYSIS DIPSTICK
Blood, UA: NEGATIVE
Glucose, UA: NEGATIVE
Ketones, UA: NEGATIVE
LEUKOCYTES UA: NEGATIVE
Nitrite, UA: NEGATIVE
Protein, UA: NEGATIVE
Spec Grav, UA: 1.02
pH, UA: 5

## 2013-09-04 LAB — US OB COMP + 14 WK

## 2013-09-04 NOTE — Progress Notes (Signed)
Subjective: Sydney Walters is a 28 y.o. at 5818 3/7 weeks by LMP  Patient denies vaginal leaking of fluid or bleeding, denies contractions.  Reports positive fetal movment.  Yesterday patient had spotting from noon-1pm, dark red/brown. Small amount. Following masturbation. Had some mild cramping that resolved w/ rest. Patient reports anxiety and is tearful discussing that she wasn't sure what to do.   Objective: There were no vitals filed for this visit. 150 FHR @U  Fundal Height Fetal Position NA   Assessment: Patient Active Problem List   Diagnosis Date Noted  . Supervision of other normal pregnancy 08/22/2013  . Weight gain 07/02/2013  . Positive pregnancy test 07/02/2013  . Complete spontaneous abortion 07/23/2011  . DEPRESSION 05/19/2010  . ALLERGIC RHINITIS 05/19/2010  . ASTHMA 05/19/2010  Scant bleeding in pregnancy, currently stable, +fetal heart rate, O+.  Plan: Patient to return to clinic for scheduled visit Reviewed warning signs in pregnancy. Patient to call with concerns PRN. Reviewed triage location. Quad and CF today Reviewed warning signs in pregnancy.   Sydney Walters CNM

## 2013-09-04 NOTE — Addendum Note (Signed)
Addended by: Odessa FlemingBOHNE, Kora Groom M on: 09/04/2013 04:41 PM   Modules accepted: Orders

## 2013-09-05 LAB — AFP, QUAD SCREEN
AFP: 43.4 IU/mL
Age Alone: 1:894 {titer}
Curr Gest Age: 18.3 wks.days
Down Syndrome Scr Risk Est: <1:38500 {titer}
HCG, Total: 16452 m[IU]/mL
INH: 126.8 pg/mL
Interpretation-AFP: NEGATIVE
MoM for AFP: 1.15
MoM for INH: 0.79
MoM for hCG: 1.03
Open Spina bifida: NEGATIVE
Osb Risk: 1:8120 {titer}
Tri 18 Scr Risk Est: NEGATIVE
Trisomy 18 (Edward) Syndrome Interp.: <1:82000 {titer}
uE3 Mom: 1.88
uE3 Value: 1.7 ng/mL

## 2013-09-06 LAB — CYSTIC FIBROSIS DIAGNOSTIC STUDY

## 2013-09-18 IMAGING — US US OB COMP LESS 14 WK
1 series · 14 of 28 positions shown · non-contrast
Comparison: none

[Series 1: us ob comp less 14 wks · 53 acquisitions, 14 frames shown]
[im 2/53]
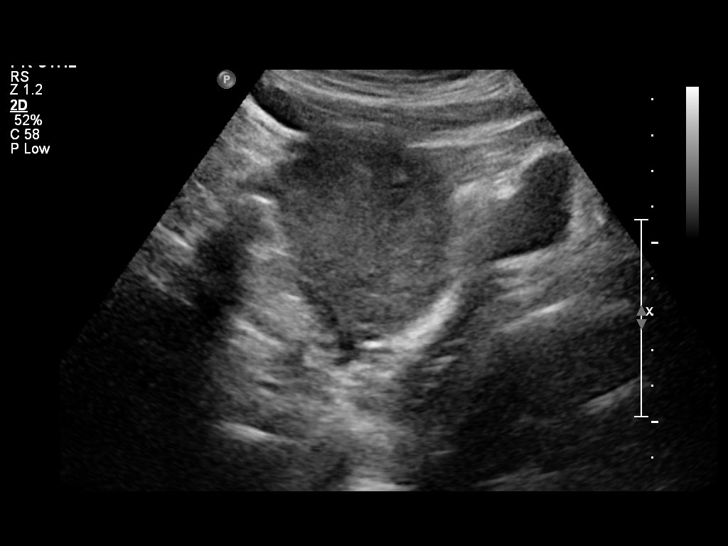
[im 6/53]
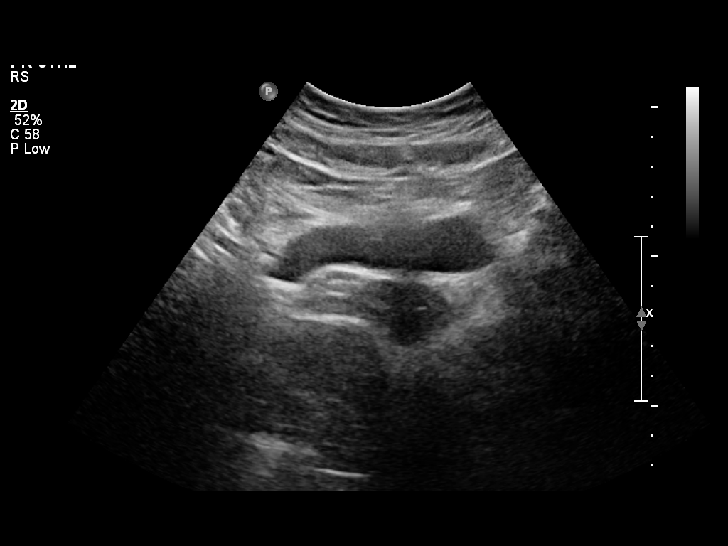
[im 10/53]
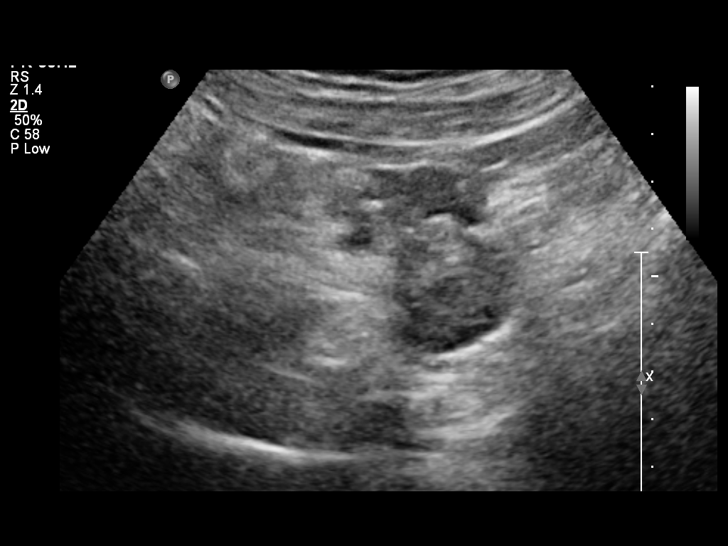
[im 14/53]
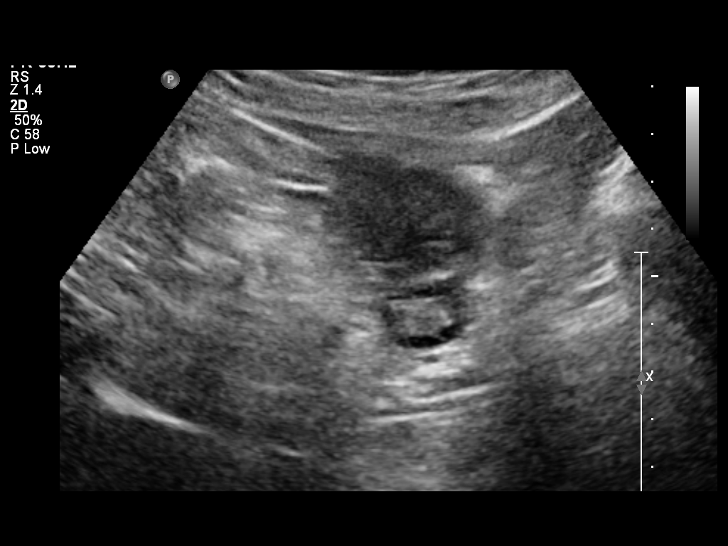
[im 18/53]
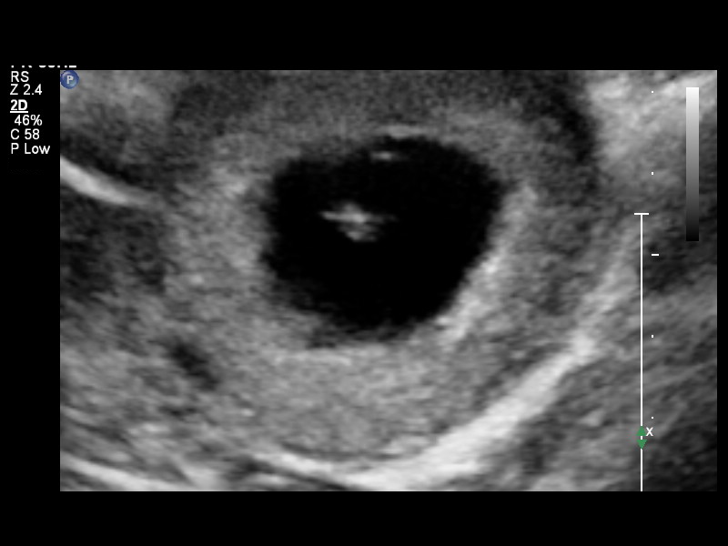
[im 22/53]
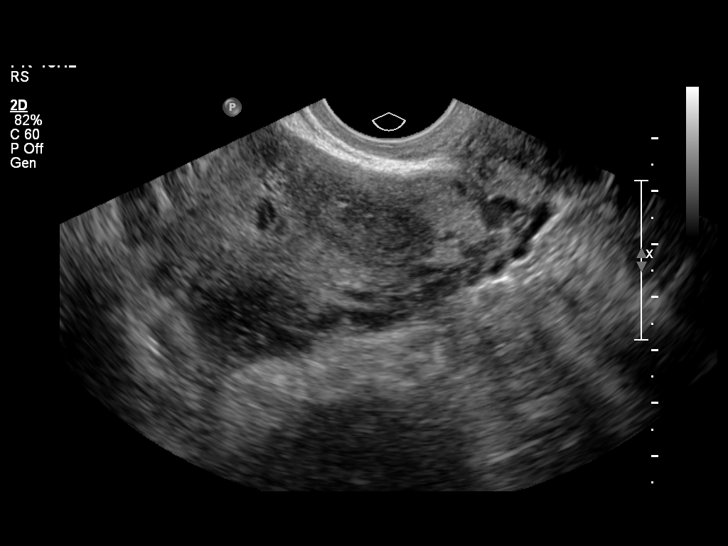
[im 26/53]
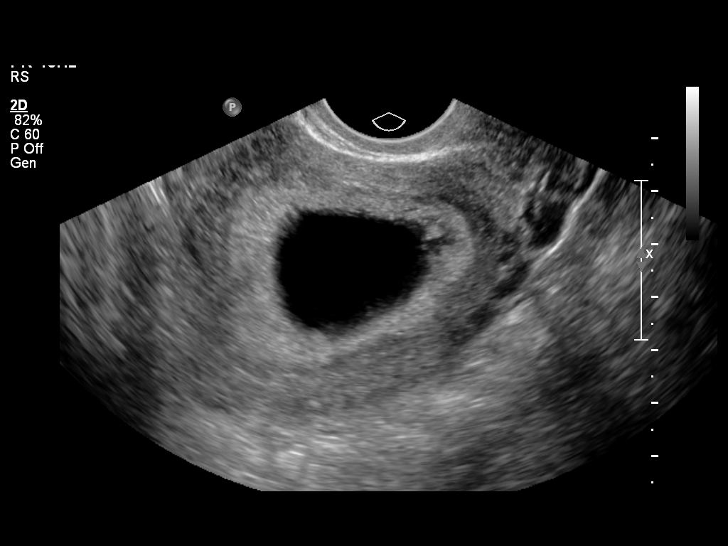
[im 29/53]
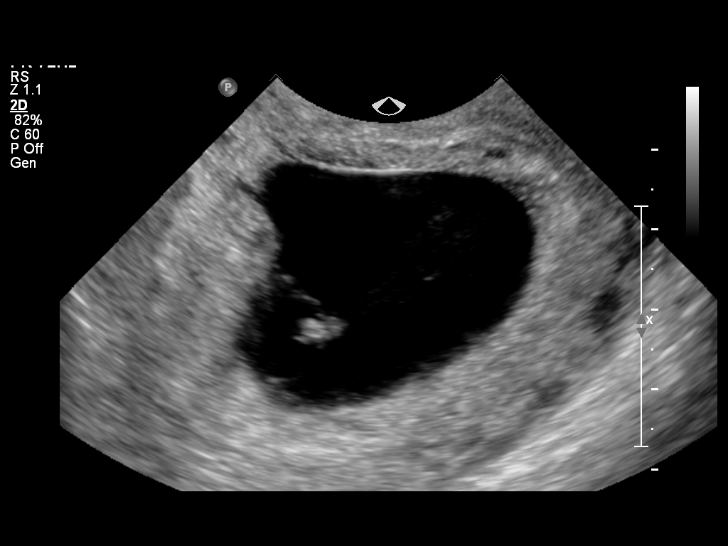
[im 33/53]
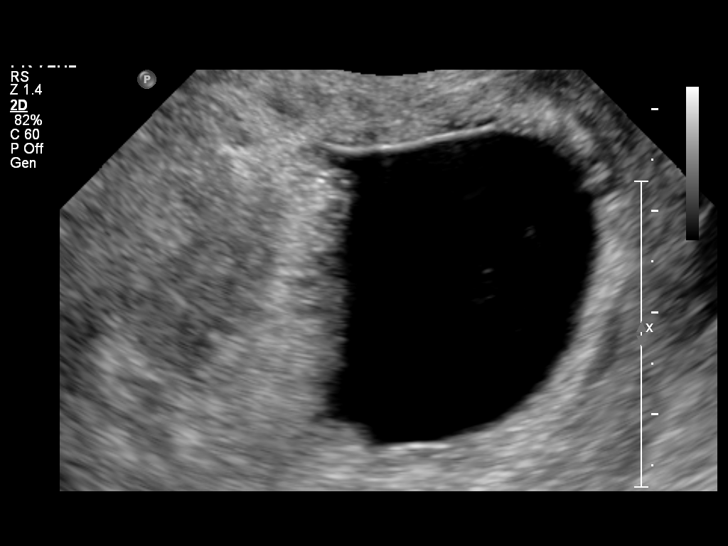
[im 37/53]
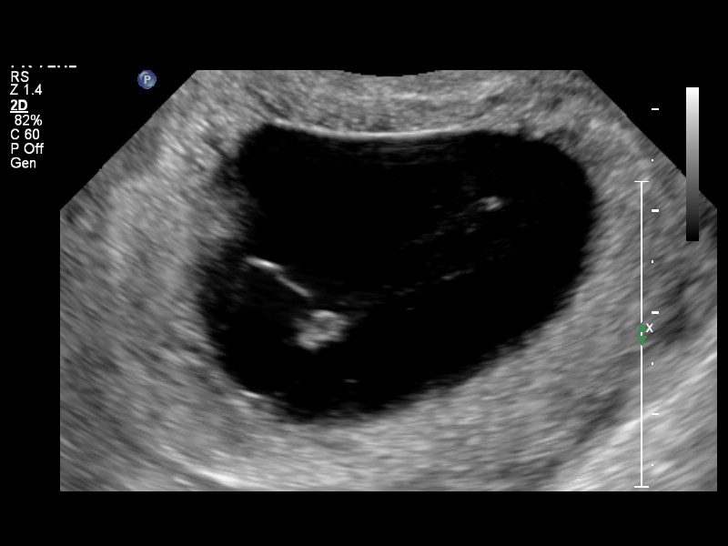
[im 41/53]
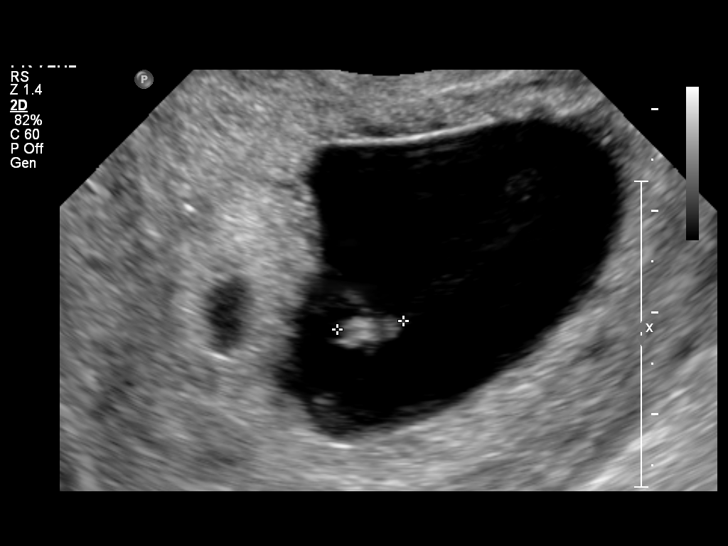
[im 45/53]
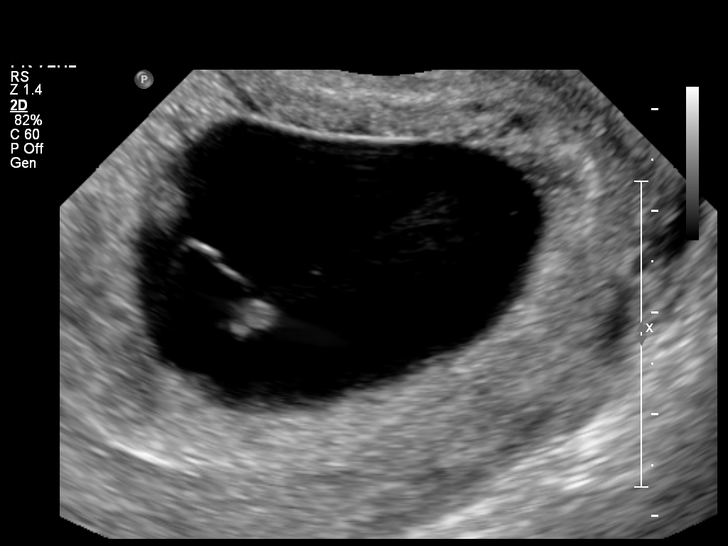
[im 49/53]
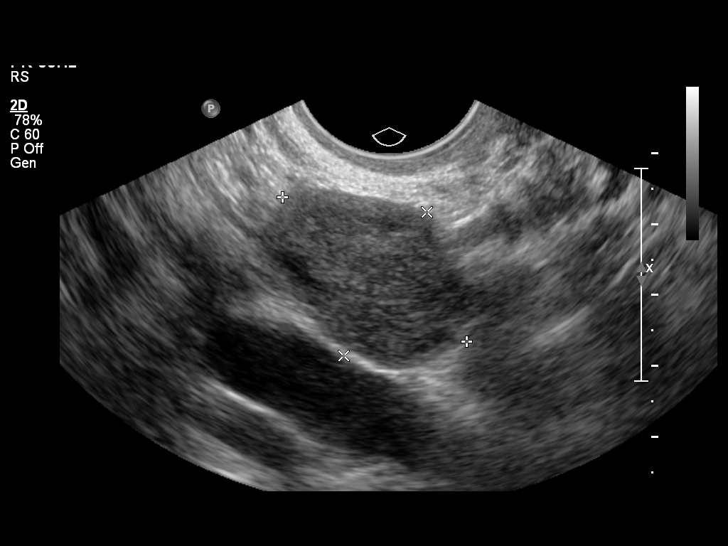
[im 53/53]
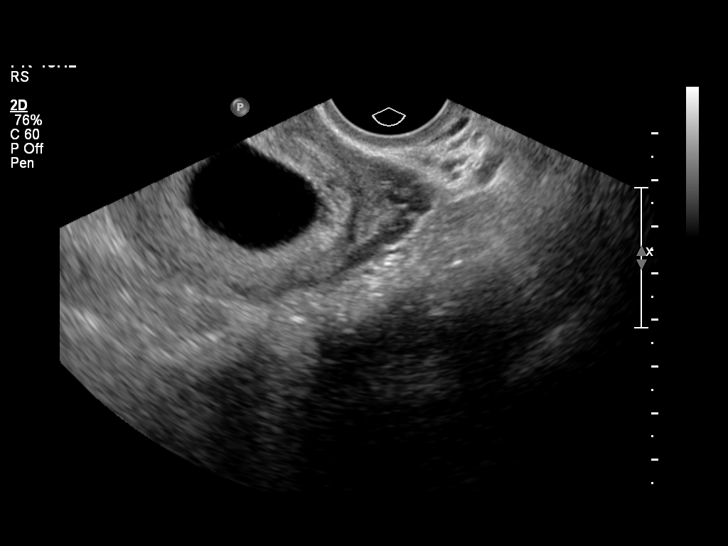

[14 of 28 positions shown; findings below may reference images not displayed]

OBSTETRICS REPORT
                      (Signed Final 03/26/2011 [DATE])

                 38_E
Procedures

 US OB COMP LESS 14 WKS                                76801.0
 US OB TRANSVAGINAL                                    76817.0
Indications

 Vaginal bleeding, unknown etiology
Fetal Evaluation

 Gest. Sac:         Intrauterine, small
                    subchorionic bleed
 Yolk Sac:          Not visualized
 Fetal Pole:        Visualized
 Cardiac Activity:  Not visualized
Biometry

 CRL:      6.5  mm    G. Age:   6w 4d                  EDD:   11/15/11
Gestational Age

 LMP:           9w 2d        Date:   01/20/11                 EDD:   10/27/11
 Best:          9w 2d     Det. By:   LMP  (01/20/11)          EDD:   10/27/11
Cervix Uterus Adnexa

 Cervix:       Closed.
 Cul De Sac:   No free fluid seen.

 Left Ovary:   Within normal limits.
 Right Ovary:  Within normal limits.
 Adnexa:     No abnormality visualized.
Impression

 Failed IUP measuring approximately 7.4wks by CRL.
 Small subchorionic hemorrhage.

 questions or concerns.

## 2013-09-19 ENCOUNTER — Ambulatory Visit (INDEPENDENT_AMBULATORY_CARE_PROVIDER_SITE_OTHER): Payer: No Typology Code available for payment source | Admitting: Obstetrics & Gynecology

## 2013-09-19 ENCOUNTER — Encounter: Payer: Self-pay | Admitting: Obstetrics & Gynecology

## 2013-09-19 VITALS — BP 112/73 | HR 91 | Temp 99.1°F | Wt 184.0 lb

## 2013-09-19 DIAGNOSIS — Z3480 Encounter for supervision of other normal pregnancy, unspecified trimester: Secondary | ICD-10-CM

## 2013-09-19 LAB — POCT URINALYSIS DIPSTICK
Bilirubin, UA: NEGATIVE
Blood, UA: NEGATIVE
Glucose, UA: NEGATIVE
Ketones, UA: NEGATIVE
Nitrite, UA: NEGATIVE
Protein, UA: NEGATIVE
Spec Grav, UA: 1.01
UROBILINOGEN UA: NEGATIVE
pH, UA: 8

## 2013-09-20 LAB — URINALYSIS
Bilirubin Urine: NEGATIVE
Glucose, UA: NEGATIVE mg/dL
Hgb urine dipstick: NEGATIVE
Ketones, ur: NEGATIVE mg/dL
LEUKOCYTES UA: NEGATIVE
NITRITE: NEGATIVE
PH: 8.5 — AB (ref 5.0–8.0)
Protein, ur: NEGATIVE mg/dL
Specific Gravity, Urine: 1.014 (ref 1.005–1.030)
Urobilinogen, UA: 1 mg/dL (ref 0.0–1.0)

## 2013-09-20 LAB — URINE CULTURE
Colony Count: NO GROWTH
Organism ID, Bacteria: NO GROWTH

## 2013-09-20 LAB — WET PREP BY MOLECULAR PROBE
CANDIDA SPECIES: POSITIVE — AB
Gardnerella vaginalis: NEGATIVE
Trichomonas vaginosis: NEGATIVE

## 2013-09-21 NOTE — Progress Notes (Signed)
Subjective:    Sydney Walters is a 28 y.o. female being seen today for her obstetrical visit. She is at [redacted]w[redacted]d gestation. Patient reports: no complaints . Fetal movement: normal.  Problem List Items Addressed This Visit   Supervision of other normal pregnancy - Primary   Relevant Orders      POCT urinalysis dipstick (Completed)      WET PREP BY MOLECULAR PROBE (Completed)      Urine culture (Completed)      Urinalysis (Completed)     Patient Active Problem List   Diagnosis Date Noted  . Supervision of other normal pregnancy 08/22/2013    Priority: High  . DEPRESSION 05/19/2010    Priority: High  . ASTHMA 05/19/2010    Priority: High  . Weight gain 07/02/2013  . Positive pregnancy test 07/02/2013  . Complete spontaneous abortion 07/23/2011  . ALLERGIC RHINITIS 05/19/2010   Objective:    BP 112/73  Pulse 91  Temp(Src) 99.1 F (37.3 C)  Wt 83.462 kg (184 lb)  PF  L/min  LMP 04/28/2013 FHT: 160 BPM  Uterine Size: size equals dates     Assessment:    Pregnancy @ [redacted]w[redacted]d    Plan:    Labs, problem list reviewed and updated 2 hr GTT planned Follow up in 4 weeks.

## 2013-09-21 NOTE — Patient Instructions (Signed)
Glucose Tolerance Test This is a test to see how your body processes carbohydrates. This test is often done to check patients for diabetes or the possibility of developing it. PREPARATION FOR TEST You should have nothing to eat or drink 12 hours before the test. You will be given a form of sugar (glucose) and then blood samples will be drawn from your vein to determine the level of sugar in your blood. Alternatively, blood may be drawn from your finger for testing. You should not smoke or exercise during the test. NORMAL FINDINGS  Fasting: 70-115 mg/dL  30 minutes: less than 200 mg/dL  1 hour: less than 200 mg/dL  2 hours: less than 140 mg/dL  3 hours: 70-115 mg/dL  4 hours: 70-115 mg/dL Ranges for normal findings may vary among different laboratories and hospitals. You should always check with your doctor after having lab work or other tests done to discuss the meaning of your test results and whether your values are considered within normal limits. MEANING OF TEST Your caregiver will go over the test results with you and discuss the importance and meaning of your results, as well as treatment options and the need for additional tests. OBTAINING THE TEST RESULTS It is your responsibility to obtain your test results. Ask the lab or department performing the test when and how you will get your results. Document Released: 05/05/2004 Document Revised: 07/05/2011 Document Reviewed: 03/23/2008 Exit[employer] Patient Information 2014 [employer].  

## 2013-09-26 ENCOUNTER — Encounter: Payer: Self-pay | Admitting: *Deleted

## 2013-09-27 ENCOUNTER — Telehealth: Payer: Self-pay | Admitting: *Deleted

## 2013-09-27 DIAGNOSIS — B379 Candidiasis, unspecified: Secondary | ICD-10-CM

## 2013-09-27 MED ORDER — TERCONAZOLE 0.4 % VA CREA: 1.0000 | TOPICAL_CREAM | Freq: Every day | VAGINAL | Status: DC

## 2013-09-27 NOTE — Telephone Encounter (Signed)
Pt called in requesting treatment for yeast infection.  After reviewing recent lab with positive yeast result, Rx sent to pharmacy.  Pt did request a cream be prescribed and Terazol 7 sent to pharmacy.

## 2013-10-04 ENCOUNTER — Encounter: Payer: No Typology Code available for payment source | Admitting: Obstetrics & Gynecology

## 2013-10-04 ENCOUNTER — Other Ambulatory Visit: Payer: No Typology Code available for payment source

## 2013-10-17 ENCOUNTER — Ambulatory Visit (INDEPENDENT_AMBULATORY_CARE_PROVIDER_SITE_OTHER): Payer: No Typology Code available for payment source | Admitting: Obstetrics

## 2013-10-17 ENCOUNTER — Other Ambulatory Visit: Payer: No Typology Code available for payment source

## 2013-10-17 ENCOUNTER — Encounter: Payer: Self-pay | Admitting: Obstetrics

## 2013-10-17 VITALS — BP 113/76 | HR 83 | Temp 97.2°F | Wt 187.0 lb

## 2013-10-17 DIAGNOSIS — Z3480 Encounter for supervision of other normal pregnancy, unspecified trimester: Secondary | ICD-10-CM

## 2013-10-17 DIAGNOSIS — K219 Gastro-esophageal reflux disease without esophagitis: Secondary | ICD-10-CM

## 2013-10-17 DIAGNOSIS — Z3482 Encounter for supervision of other normal pregnancy, second trimester: Secondary | ICD-10-CM

## 2013-10-17 DIAGNOSIS — Z1389 Encounter for screening for other disorder: Secondary | ICD-10-CM

## 2013-10-17 LAB — POCT URINALYSIS DIPSTICK
Bilirubin, UA: NEGATIVE
Glucose, UA: NEGATIVE
Ketones, UA: NEGATIVE
LEUKOCYTES UA: NEGATIVE
NITRITE UA: NEGATIVE
PH UA: 7
PROTEIN UA: NEGATIVE
Spec Grav, UA: 1.01
Urobilinogen, UA: NEGATIVE

## 2013-10-17 LAB — CBC
HCT: 36.3 % (ref 36.0–46.0)
Hemoglobin: 12.1 g/dL (ref 12.0–15.0)
MCH: 28.7 pg (ref 26.0–34.0)
MCHC: 33.3 g/dL (ref 30.0–36.0)
MCV: 86.2 fL (ref 78.0–100.0)
Platelets: 295 10*3/uL (ref 150–400)
RBC: 4.21 MIL/uL (ref 3.87–5.11)
RDW: 13.2 % (ref 11.5–15.5)
WBC: 8.6 10*3/uL (ref 4.0–10.5)

## 2013-10-17 MED ORDER — AZITHROMYCIN 250 MG PO TABS: ORAL_TABLET | ORAL | Status: DC

## 2013-10-17 MED ORDER — OMEPRAZOLE 20 MG PO CPDR: 20.0000 mg | DELAYED_RELEASE_CAPSULE | Freq: Two times a day (BID) | ORAL | Status: AC

## 2013-10-17 NOTE — Progress Notes (Signed)
Subjective:    Sydney Walters is a 28 y.o. female being seen today for her obstetrical visit. She is at 6227w4d gestation. Patient reports: no complaints . Fetal movement: normal.  Problem List Items Addressed This Visit   Supervision of other normal pregnancy - Primary   Relevant Orders      Glucose Tolerance, 2 Hours w/1 Hour      CBC      HIV antibody      RPR      POCT urinalysis dipstick    Other Visit Diagnoses   GERD without esophagitis        Relevant Medications       omeprazole (PRILOSEC) capsule    Encounter for routine screening for malformation using ultrasonics        Relevant Orders       US OB Follow Up      Patient Active Problem List   Diagnosis Date Noted  . Supervision of other normal pregnancy 08/22/2013  . Weight gain 07/02/2013  . Positive pregnancy test 07/02/2013  . Complete spontaneous abortion 07/23/2011  . DEPRESSION 05/19/2010  . ALLERGIC RHINITIS 05/19/2010  . ASTHMA 05/19/2010   Objective:    BP 113/76  Pulse 83  Temp(Src) 97.2 F (36.2 C)  Wt 187 lb (84.823 kg)  LMP 04/28/2013 FHT: 160 BPM  Uterine Size: size equals dates     Assessment:    Pregnancy @ 4427w4d    Plan:    OBGCT: ordered. Signs and symptoms of preterm labor: discussed.  Labs, problem list reviewed and updated 2 hr GTT planned Follow up in 2 weeks.

## 2013-10-18 LAB — HIV ANTIBODY (ROUTINE TESTING W REFLEX): HIV: NONREACTIVE

## 2013-10-18 LAB — GLUCOSE TOLERANCE, 2 HOURS W/ 1HR
GLUCOSE, 2 HOUR: 112 mg/dL (ref 70–139)
GLUCOSE, FASTING: 78 mg/dL (ref 70–99)
GLUCOSE: 133 mg/dL (ref 70–170)

## 2013-10-18 LAB — RPR

## 2013-10-19 ENCOUNTER — Other Ambulatory Visit: Payer: Self-pay | Admitting: Obstetrics

## 2013-10-23 ENCOUNTER — Ambulatory Visit (INDEPENDENT_AMBULATORY_CARE_PROVIDER_SITE_OTHER): Payer: Medicaid Other

## 2013-10-23 DIAGNOSIS — Z1389 Encounter for screening for other disorder: Secondary | ICD-10-CM

## 2013-10-23 LAB — US OB FOLLOW UP

## 2013-10-23 NOTE — Telephone Encounter (Unsigned)
I looked into

## 2013-10-31 ENCOUNTER — Encounter: Payer: No Typology Code available for payment source | Admitting: Obstetrics

## 2013-11-07 ENCOUNTER — Encounter: Payer: No Typology Code available for payment source | Admitting: Obstetrics

## 2013-11-08 ENCOUNTER — Encounter: Payer: No Typology Code available for payment source | Admitting: Obstetrics

## 2013-11-15 ENCOUNTER — Ambulatory Visit (INDEPENDENT_AMBULATORY_CARE_PROVIDER_SITE_OTHER): Payer: No Typology Code available for payment source | Admitting: Obstetrics

## 2013-11-15 VITALS — BP 105/73 | HR 91 | Temp 97.7°F | Wt 190.0 lb

## 2013-11-15 DIAGNOSIS — O359XX0 Maternal care for (suspected) fetal abnormality and damage, unspecified, not applicable or unspecified: Secondary | ICD-10-CM | POA: Insufficient documentation

## 2013-11-15 DIAGNOSIS — Z3483 Encounter for supervision of other normal pregnancy, third trimester: Secondary | ICD-10-CM

## 2013-11-15 DIAGNOSIS — Z3480 Encounter for supervision of other normal pregnancy, unspecified trimester: Secondary | ICD-10-CM

## 2013-11-15 NOTE — Progress Notes (Signed)
Subjective:    Sydney Walters is a 28 y.o. female being seen today for her obstetrical visit. She is at 7733w5d gestation. Patient reports no complaints. Fetal movement: normal.  Problem List Items Addressed This Visit   Supervision of other normal pregnancy - Primary   Relevant Orders      POCT urinalysis dipstick      US OB Follow Up   Unspecified fetal abnormality affecting management of mother, antepartum condition or complication   Relevant Orders      AMB Referral to Maternal Fetal Medicine (MFM)     Patient Active Problem List   Diagnosis Date Noted  . Unspecified fetal abnormality affecting management of mother, antepartum condition or complication 11/15/2013  . GERD without esophagitis 10/17/2013  . Supervision of other normal pregnancy 08/22/2013  . Weight gain 07/02/2013  . Positive pregnancy test 07/02/2013  . Complete spontaneous abortion 07/23/2011  . DEPRESSION 05/19/2010  . ALLERGIC RHINITIS 05/19/2010  . ASTHMA 05/19/2010   Objective:    BP 105/73  Pulse 91  Temp(Src) 97.7 F (36.5 C)  Wt 190 lb (86.183 kg)  LMP 04/28/2013 FHT:  160 BPM  Uterine Size: size equals dates  Presentation: unsure     Assessment:    Pregnancy @ 2133w5d weeks   Plan:     labs reviewed, problem list updated Consent signed. GBS sent TDAP offered  Rhogam given for RH negative Pediatrician: discussed. Infant feeding: plans to breastfeed. Maternity leave: discussed. Cigarette smoking: former smoker. Orders Placed This Encounter  Procedures  . 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    Order Specific Question:  Preferred imaging location?    Answer:  [facility]  . AMB Referral to Maternal Fetal Medicine (MFM)    Referral Priority:  Routine    Referral Type:  Consultation   Number of Visits Requested:  1  . POCT urinalysis dipstick   No orders of the defined types were placed in this encounter.   Follow up in 2 Weeks.

## 2013-11-19 LAB — POCT URINALYSIS DIPSTICK
Blood, UA: NEGATIVE
Glucose, UA: NEGATIVE
KETONES UA: NEGATIVE
LEUKOCYTES UA: NEGATIVE
NITRITE UA: NEGATIVE
PH UA: 7
PROTEIN UA: NEGATIVE
Spec Grav, UA: <=1.005

## 2013-11-28 ENCOUNTER — Encounter: Payer: Self-pay | Admitting: Obstetrics

## 2013-11-28 ENCOUNTER — Encounter (HOSPITAL_COMMUNITY): Payer: Self-pay

## 2013-11-28 ENCOUNTER — Ambulatory Visit (INDEPENDENT_AMBULATORY_CARE_PROVIDER_SITE_OTHER): Payer: No Typology Code available for payment source | Admitting: Obstetrics

## 2013-11-28 ENCOUNTER — Ambulatory Visit (HOSPITAL_COMMUNITY)
Admission: RE | Admit: 2013-11-28 | Discharge: 2013-11-28 | Disposition: A | Discharge status: Home / Self Care | Payer: Medicaid Other | Source: Ambulatory Visit | Attending: Obstetrics | Admitting: Obstetrics

## 2013-11-28 VITALS — BP 112/72 | HR 97 | Temp 97.6°F | Wt 194.0 lb

## 2013-11-28 DIAGNOSIS — O358XX0 Maternal care for other (suspected) fetal abnormality and damage, not applicable or unspecified: Secondary | ICD-10-CM | POA: Insufficient documentation

## 2013-11-28 DIAGNOSIS — J0111 Acute recurrent frontal sinusitis: Secondary | ICD-10-CM

## 2013-11-28 DIAGNOSIS — Z363 Encounter for antenatal screening for malformations: Secondary | ICD-10-CM | POA: Insufficient documentation

## 2013-11-28 DIAGNOSIS — Z1389 Encounter for screening for other disorder: Secondary | ICD-10-CM | POA: Insufficient documentation

## 2013-11-28 DIAGNOSIS — Z3480 Encounter for supervision of other normal pregnancy, unspecified trimester: Secondary | ICD-10-CM

## 2013-11-28 DIAGNOSIS — Z3483 Encounter for supervision of other normal pregnancy, third trimester: Secondary | ICD-10-CM

## 2013-11-28 DIAGNOSIS — O352XX0 Maternal care for (suspected) hereditary disease in fetus, not applicable or unspecified: Secondary | ICD-10-CM | POA: Insufficient documentation

## 2013-11-28 DIAGNOSIS — J0110 Acute frontal sinusitis, unspecified: Secondary | ICD-10-CM

## 2013-11-28 LAB — POCT URINALYSIS DIPSTICK
Blood, UA: NEGATIVE
Glucose, UA: NEGATIVE
Ketones, UA: NEGATIVE
Nitrite, UA: NEGATIVE
PROTEIN UA: NEGATIVE
Spec Grav, UA: <=1.005
pH, UA: 8

## 2013-11-28 MED ORDER — AZITHROMYCIN 250 MG PO TABS: ORAL_TABLET | ORAL | Status: DC

## 2013-11-28 NOTE — Progress Notes (Signed)
Subjective:    Sydney Walters is a 28 y.o. female being seen today for her obstetrical visit. She is at 5149w4d gestation. Patient reports sinusitis. Fetal movement: normal.  Problem List Items Addressed This Visit   Supervision of other normal pregnancy - Primary   Relevant Orders      POCT urinalysis dipstick (Completed)    Other Visit Diagnoses   Acute recurrent frontal sinusitis        Relevant Medications       azithromycin (ZITHROMAX) tablet      Patient Active Problem List   Diagnosis Date Noted  . Unspecified fetal abnormality affecting management of mother, antepartum condition or complication 11/15/2013  . GERD without esophagitis 10/17/2013  . Supervision of other normal pregnancy 08/22/2013  . Weight gain 07/02/2013  . Positive pregnancy test 07/02/2013  . Complete spontaneous abortion 07/23/2011  . DEPRESSION 05/19/2010  . ALLERGIC RHINITIS 05/19/2010  . ASTHMA 05/19/2010   Objective:    BP 112/72  Pulse 97  Temp(Src) 97.6 F (36.4 C)  Wt 194 lb (87.998 kg)  LMP 04/28/2013 FHT:  150 BPM  Uterine Size: size equals dates  Presentation: unsure     Assessment:    Pregnancy @ 4349w4d weeks   Plan:     labs reviewed, problem list updated Consent signed. GBS sent TDAP offered  Rhogam given for RH negative Pediatrician: discussed. Infant feeding: plans to breastfeed. Maternity leave: not discussed. Cigarette smoking: quit date 2010. Orders Placed This Encounter  Procedures  . POCT urinalysis dipstick   Meds ordered this encounter  Medications  . azithromycin (ZITHROMAX Z-PAK) 250 MG tablet    Sig: Take 2 tablets po loading dose, then 1 tab po daily for 14 days.    Dispense:  15 each    Refill:  2   Follow up in 2 Weeks.

## 2013-12-13 ENCOUNTER — Ambulatory Visit (INDEPENDENT_AMBULATORY_CARE_PROVIDER_SITE_OTHER): Payer: Self-pay | Admitting: Obstetrics

## 2013-12-13 VITALS — BP 110/73 | HR 111 | Temp 97.1°F | Wt 194.0 lb

## 2013-12-13 DIAGNOSIS — Z3483 Encounter for supervision of other normal pregnancy, third trimester: Secondary | ICD-10-CM

## 2013-12-13 DIAGNOSIS — B3731 Acute candidiasis of vulva and vagina: Secondary | ICD-10-CM

## 2013-12-13 DIAGNOSIS — Z0289 Encounter for other administrative examinations: Secondary | ICD-10-CM

## 2013-12-13 DIAGNOSIS — B373 Candidiasis of vulva and vagina: Secondary | ICD-10-CM

## 2013-12-13 DIAGNOSIS — Z3480 Encounter for supervision of other normal pregnancy, unspecified trimester: Secondary | ICD-10-CM

## 2013-12-13 LAB — POCT URINALYSIS DIPSTICK
Bilirubin, UA: NEGATIVE
GLUCOSE UA: NEGATIVE
Ketones, UA: NEGATIVE
Leukocytes, UA: NEGATIVE
NITRITE UA: NEGATIVE
RBC UA: NEGATIVE
Spec Grav, UA: 1.025
UROBILINOGEN UA: NEGATIVE
pH, UA: 5

## 2013-12-13 MED ORDER — FLUCONAZOLE 150 MG PO TABS: 150.0000 mg | ORAL_TABLET | Freq: Once | ORAL | Status: DC

## 2013-12-13 NOTE — Progress Notes (Signed)
Subjective:    Sydney Walters is a 28 y.o. female being seen today for her obstetrical visit. She is at 1554w5d gestation. Patient reports vaginal irritation. Fetal movement: normal.  Problem List Items Addressed This Visit   Candidiasis of vulva and vagina   Relevant Medications      fluconazole (DIFLUCAN) tablet 150 mg    Other Visit Diagnoses   Encounter for supervision of other normal pregnancy in third trimester    -  Primary    Relevant Orders       POCT urinalysis dipstick (Completed)      Patient Active Problem List   Diagnosis Date Noted  . Candidiasis of vulva and vagina 12/13/2013  . Acute recurrent frontal sinusitis 11/28/2013  . Unspecified fetal abnormality affecting management of mother, antepartum condition or complication 11/15/2013  . GERD without esophagitis 10/17/2013  . Supervision of other normal pregnancy 08/22/2013  . Weight gain 07/02/2013  . Positive pregnancy test 07/02/2013  . Complete spontaneous abortion 07/23/2011  . DEPRESSION 05/19/2010  . ALLERGIC RHINITIS 05/19/2010  . ASTHMA 05/19/2010   Objective:    BP 110/73  Pulse 111  Temp(Src) 97.1 F (36.2 C)  Wt 194 lb (87.998 kg)  LMP 04/28/2013 FHT:  150 BPM  Uterine Size: size equals dates  Presentation: unsure     Assessment:    Pregnancy @ 6054w5d weeks   Plan:     labs reviewed, problem list updated Consent signed. GBS sent TDAP offered  Rhogam given for RH negative Pediatrician: discussed. Infant feeding: plans to breastfeed. Maternity leave: discussed. Cigarette smoking: quit 2010. Orders Placed This Encounter  Procedures  . POCT urinalysis dipstick   Meds ordered this encounter  Medications  . fexofenadine-pseudoephedrine (ALLEGRA-D 24) 180-240 MG per 24 hr tablet    Sig: Take 1 tablet by mouth daily.  . fluconazole (DIFLUCAN) 150 MG tablet    Sig: Take 1 tablet (150 mg total) by mouth once.    Dispense:  1 tablet    Refill:  2   Follow up in 2 Weeks.

## 2013-12-24 IMAGING — US US PELVIS COMPLETE
1 series · 13 of 25 positions shown · non-contrast
Comparison: Obstetrical ultrasound 03/26/2011.

CLINICAL DATA: Spontaneous abortion March 2011. Pelvic pain with
abnormal vaginal bleeding. Negative urine pregnancy test.

TRANSABDOMINAL AND TRANSVAGINAL ULTRASOUND OF PELVIS
TECHNIQUE: Both transabdominal and transvaginal ultrasound
examinations of the pelvis were performed. Transabdominal technique
was performed for global imaging of the pelvis including uterus,
ovaries, adnexal regions, and pelvic cul-de-sac.

[Series 1: us pelvis complete · 61 acquisitions, 13 frames shown]
[im 1/61]
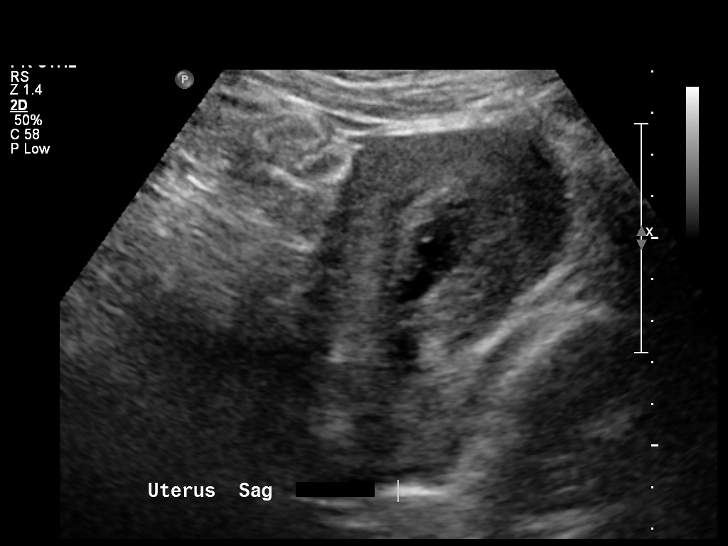
[im 6/61]
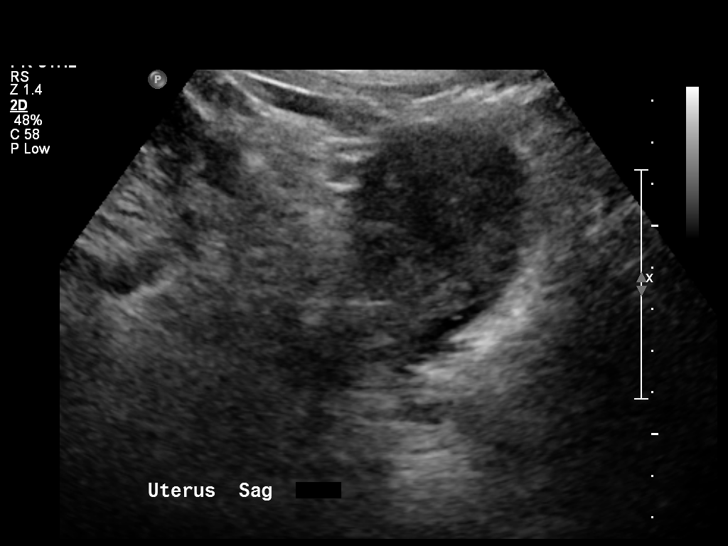
[im 11/61]
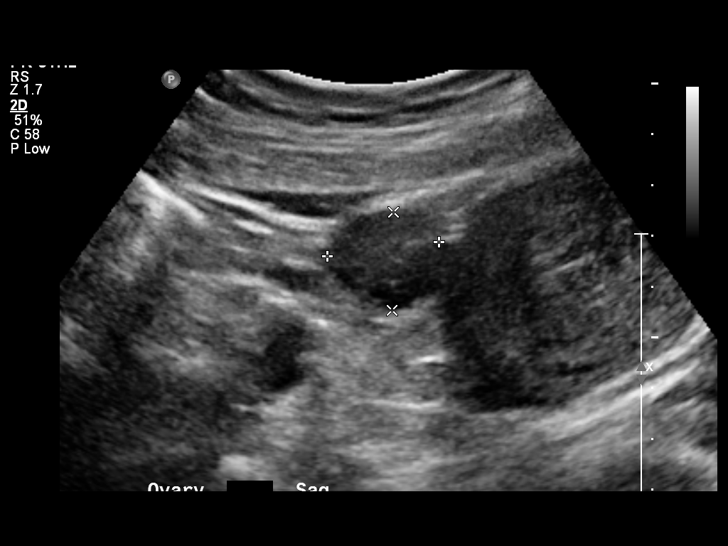
[im 16/61]
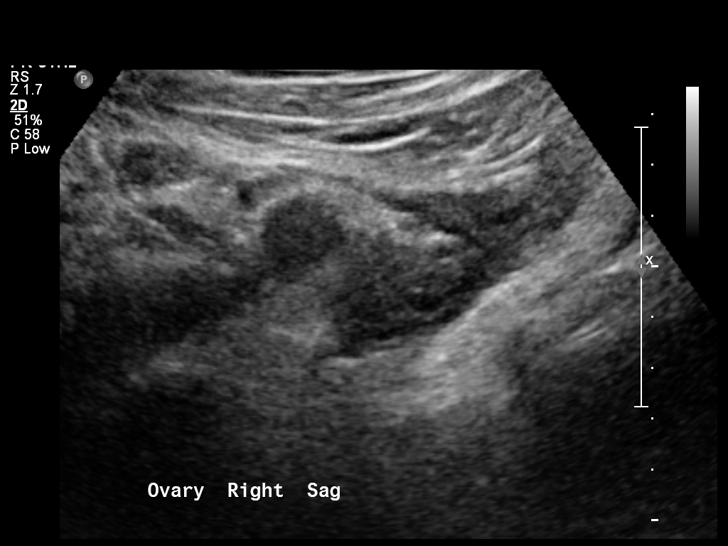
[im 21/61]
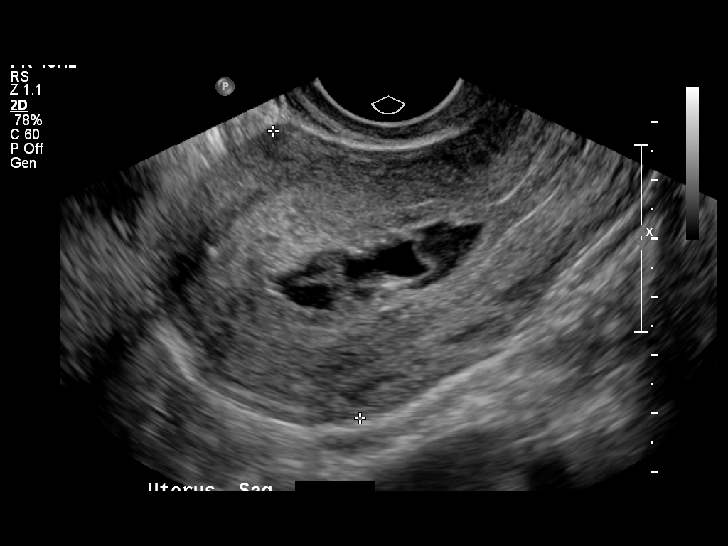
[im 26/61]
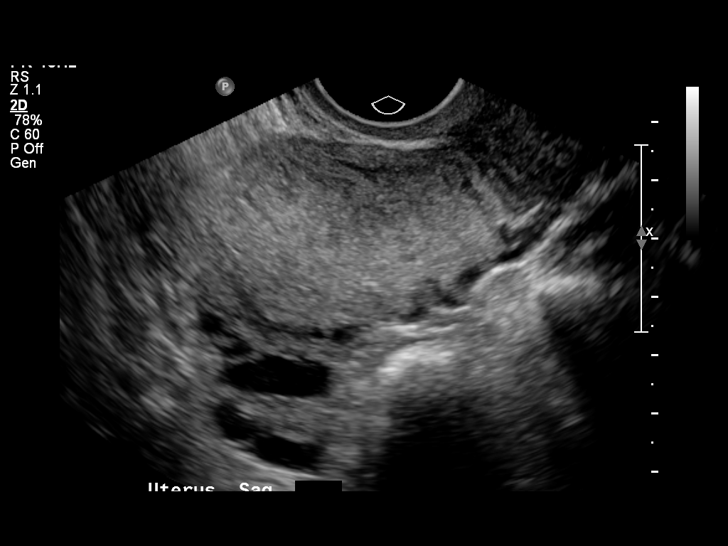
[im 31/61]
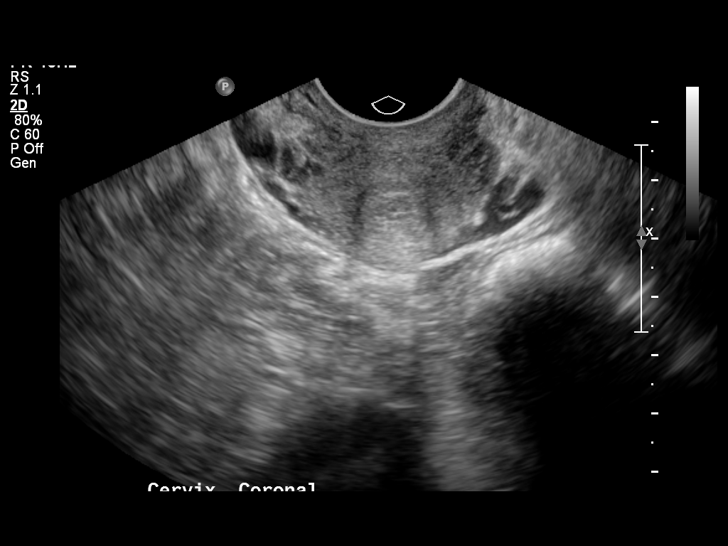
[im 36/61]
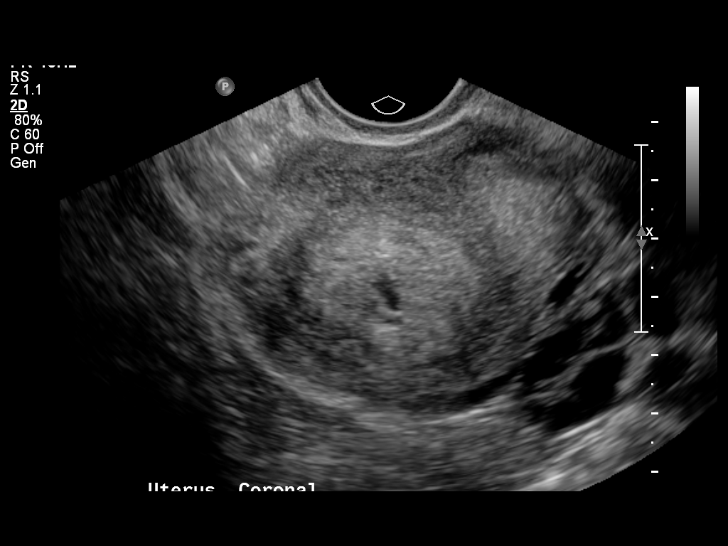
[im 41/61]
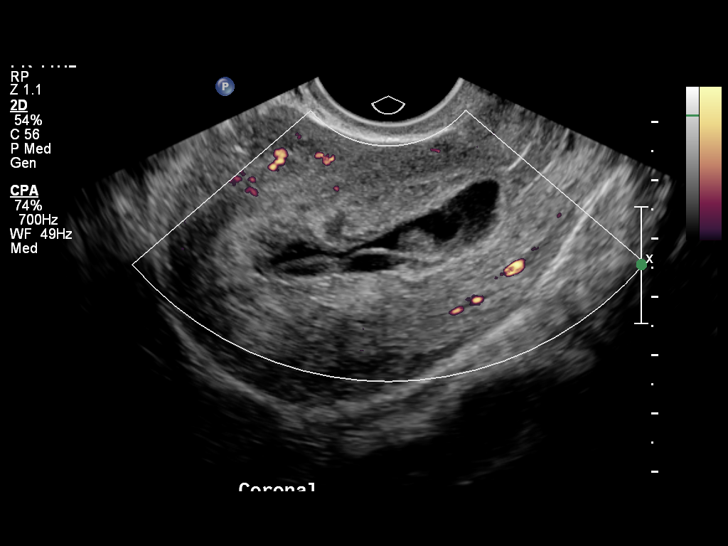
[im 46/61]
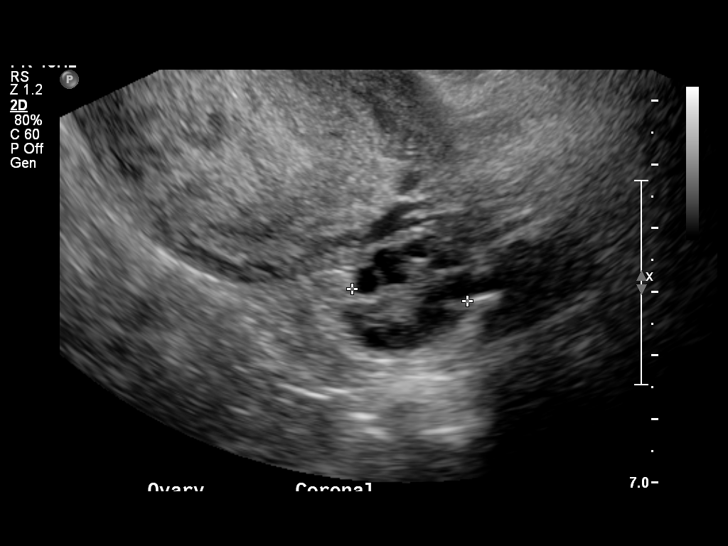
[im 51/61]
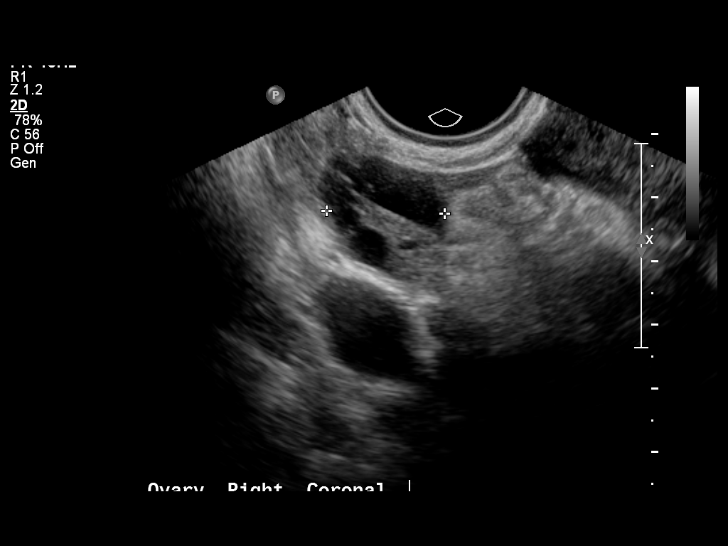
[im 56/61]
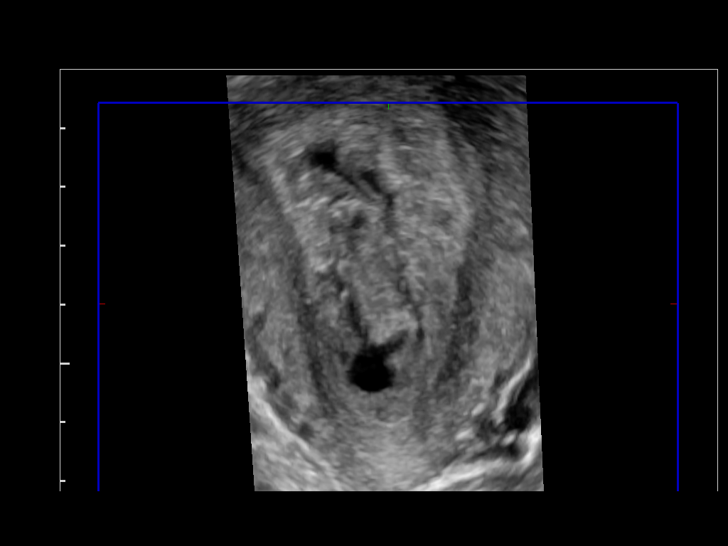
[im 61/61]
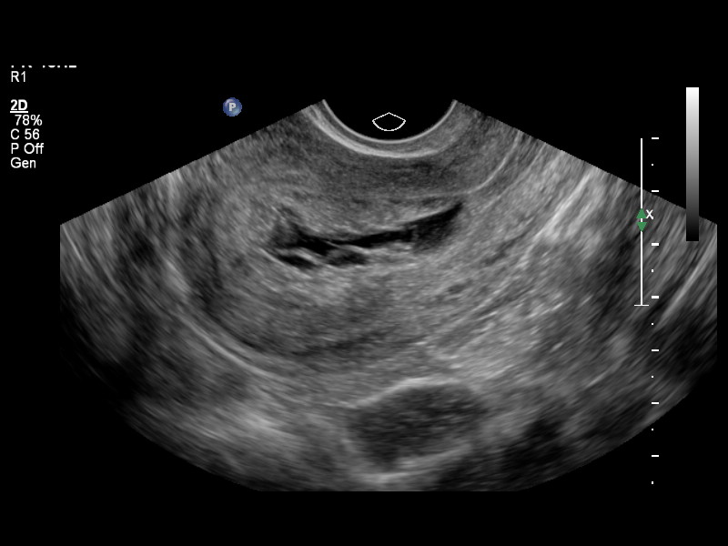

[13 of 25 positions shown; findings below may reference images not displayed]

It was necessary to proceed with endovaginal exam following the
transabdominal exam to visualize the endometrium to better
advantage..
FINDINGS: Uterus: Measures approximately 9.9 x 5.2 x 6.1 cm.  No myometrial
abnormalities are identified.  There is a complex fluid within the
endometrial canal with multiple septations.  This fluid measures up
to 1.7 cm in thickness.  No significant echogenic or hypervascular
componentsare demonstrated.

Right ovary:  Normal in appearance, measuring 3.1 x 1.9 x 1.9 cm.
No adnexal mass.

Left ovary: Normal in appearance, measuring 2.6 x 1.8 x 1.8 cm.  No
adnexal mass.

Other findings: No free fluid
IMPRESSION: Complex septated fluid within the endometrial canal without typical
findings of retained products of conception. Infection not
excluded.  Correlate clinically.  No adnexal abnormalities..

## 2013-12-26 ENCOUNTER — Ambulatory Visit (INDEPENDENT_AMBULATORY_CARE_PROVIDER_SITE_OTHER): Payer: Medicaid Other | Admitting: Obstetrics

## 2013-12-26 ENCOUNTER — Encounter: Payer: Self-pay | Admitting: *Deleted

## 2013-12-26 VITALS — BP 124/84 | HR 103 | Temp 97.1°F | Wt 199.0 lb

## 2013-12-26 DIAGNOSIS — Z3480 Encounter for supervision of other normal pregnancy, unspecified trimester: Secondary | ICD-10-CM

## 2013-12-26 DIAGNOSIS — Z3483 Encounter for supervision of other normal pregnancy, third trimester: Secondary | ICD-10-CM

## 2013-12-26 LAB — POCT URINALYSIS DIPSTICK
Bilirubin, UA: NEGATIVE
Glucose, UA: NEGATIVE
Ketones, UA: NEGATIVE
Nitrite, UA: NEGATIVE
Protein, UA: NEGATIVE
RBC UA: NEGATIVE
SPEC GRAV UA: 1.015
UROBILINOGEN UA: NEGATIVE
pH, UA: 6.5

## 2013-12-26 NOTE — Progress Notes (Signed)
Subjective:    Sydney Walters is a 28 y.o. female being seen today for her obstetrical visit. She is at [redacted]w[redacted]d gestation. Patient reports backache, fatigue and swelling and pain in legs and feet.  Fetal movement: normal.  Problem List Items Addressed This Visit   None    Visit Diagnoses   Encounter for supervision of other normal pregnancy in third trimester    -  Primary    Relevant Orders       POCT urinalysis dipstick      Patient Active Problem List   Diagnosis Date Noted  . Candidiasis of vulva and vagina 12/13/2013  . Acute recurrent frontal sinusitis 11/28/2013  . Unspecified fetal abnormality affecting management of mother, antepartum condition or complication 11/15/2013  . GERD without esophagitis 10/17/2013  . Supervision of other normal pregnancy 08/22/2013  . Weight gain 07/02/2013  . Positive pregnancy test 07/02/2013  . Complete spontaneous abortion 07/23/2011  . DEPRESSION 05/19/2010  . ALLERGIC RHINITIS 05/19/2010  . ASTHMA 05/19/2010   Objective:    BP 124/84  Pulse 103  Temp(Src) 97.1 F (36.2 C)  Wt 199 lb (90.266 kg)  LMP 04/28/2013 FHT:  150 BPM  Uterine Size: size equals dates  Presentation: unsure     Assessment:    Pregnancy @ [redacted]w[redacted]d weeks .  Starting to feel the discomfort increasingly from being on feet at work all day.    Plan:   Recommended starting maternity leave because of severe discomfort at the end of the work day.    labs reviewed, problem list updated Consent signed. GBS sent TDAP offered  Rhogam given for RH negative Pediatrician: discussed. Infant feeding: plans to breastfeed. Maternity leave: discussed. Cigarette smoking: quit 2010. Orders Placed This Encounter  Procedures  . POCT urinalysis dipstick   No orders of the defined types were placed in this encounter.   Follow up in 1 Week.

## 2013-12-27 ENCOUNTER — Encounter: Payer: Self-pay | Admitting: Obstetrics

## 2014-01-02 ENCOUNTER — Encounter: Payer: Self-pay | Admitting: Obstetrics & Gynecology

## 2014-01-02 ENCOUNTER — Ambulatory Visit (INDEPENDENT_AMBULATORY_CARE_PROVIDER_SITE_OTHER): Payer: Medicaid Other | Admitting: Obstetrics & Gynecology

## 2014-01-02 VITALS — BP 124/84 | HR 96 | Temp 98.1°F | Wt 200.0 lb

## 2014-01-02 DIAGNOSIS — Z3483 Encounter for supervision of other normal pregnancy, third trimester: Secondary | ICD-10-CM

## 2014-01-02 DIAGNOSIS — Z3480 Encounter for supervision of other normal pregnancy, unspecified trimester: Secondary | ICD-10-CM

## 2014-01-02 LAB — CREATININE, SERUM: Creat: 0.58 mg/dL (ref 0.50–1.10)

## 2014-01-02 LAB — POCT URINALYSIS DIPSTICK
Bilirubin, UA: NEGATIVE
Glucose, UA: NEGATIVE
KETONES UA: NEGATIVE
Nitrite, UA: NEGATIVE
PH UA: 7
PROTEIN UA: NEGATIVE
RBC UA: NEGATIVE
SPEC GRAV UA: 1.01
Urobilinogen, UA: NEGATIVE

## 2014-01-02 LAB — CBC
HEMATOCRIT: 36.3 % (ref 36.0–46.0)
HEMOGLOBIN: 12.6 g/dL (ref 12.0–15.0)
MCH: 28.8 pg (ref 26.0–34.0)
MCHC: 34.7 g/dL (ref 30.0–36.0)
MCV: 83.1 fL (ref 78.0–100.0)
Platelets: 246 10*3/uL (ref 150–400)
RBC: 4.37 MIL/uL (ref 3.87–5.11)
RDW: 13 % (ref 11.5–15.5)
WBC: 10.1 10*3/uL (ref 4.0–10.5)

## 2014-01-02 LAB — AST: AST: 21 U/L (ref 0–37)

## 2014-01-02 LAB — ALT: ALT: 17 U/L (ref 0–35)

## 2014-01-02 LAB — LACTATE DEHYDROGENASE: LDH: 157 U/L (ref 94–250)

## 2014-01-02 MED ORDER — DOXYLAMINE-PYRIDOXINE 10-10 MG PO TBEC: 2.0000 | DELAYED_RELEASE_TABLET | Freq: Every day | ORAL | Status: DC

## 2014-01-02 NOTE — Progress Notes (Signed)
Subjective:    Sydney Walters is a 28 y.o. female being seen today for her obstetrical visit. She is at [redacted]w[redacted]d gestation. Patient reports nausea, decreased appetite.  Fetal movement: normal. Peak flow > 300 Problem List Items Addressed This Visit     High   Supervision of other normal pregnancy - Primary   Relevant Orders      POCT urinalysis dipstick      Strep B DNA probe      Lactate dehydrogenase      Creatinine, serum      CBC      ALT      Protein / creatinine ratio, urine      Pathologist smear review      AST     Patient Active Problem List   Diagnosis Date Noted  . Supervision of other normal pregnancy 08/22/2013    Priority: High  . DEPRESSION 05/19/2010    Priority: High  . ASTHMA 05/19/2010    Priority: High  . Candidiasis of vulva and vagina 12/13/2013  . Acute recurrent frontal sinusitis 11/28/2013  . Unspecified fetal abnormality affecting management of mother, antepartum condition or complication 11/15/2013  . GERD without esophagitis 10/17/2013  . Weight gain 07/02/2013  . Positive pregnancy test 07/02/2013  . Complete spontaneous abortion 07/23/2011  . ALLERGIC RHINITIS 05/19/2010   Objective:    BP 124/84  Pulse 96  Temp(Src) 98.1 F (36.7 C)  Wt 90.719 kg (200 lb)  LMP 04/28/2013 FHT:  150 BPM  Uterine Size: size equals dates  Presentation: unsure     Assessment:    Pregnancy @ [redacted]w[redacted]d weeks .    Plan:      labs reviewed, problem list updated I Orders Placed This Encounter  Procedures  . Strep B DNA probe  . Lactate dehydrogenase  . Creatinine, serum  . CBC  . ALT  . Protein / creatinine ratio, urine  . Pathologist smear review  . AST  . POCT urinalysis dipstick   Resume Diclegis/continue PPI Follow up tomorrow

## 2014-01-02 NOTE — Addendum Note (Signed)
Addended by: Odessa Fleming on: 01/02/2014 03:32 PM   Modules accepted: Orders

## 2014-01-02 NOTE — Progress Notes (Signed)
Peek Flow- 320, 330, and 340.

## 2014-01-02 NOTE — Patient Instructions (Signed)
Patient information: Group B streptococcus and pregnancy (Beyond the Basics)  Authors Karen M Puopolo, MD, PhD Carol J Baker, MD Section Editors Charles J Lockwood, MD Daniel J Sexton, MD Deputy Editor Vanessa A Barss, MD Disclosures  All topics are updated as new evidence becomes available and our peer review process is complete.  Literature review current through: Feb 2014.  This topic last updated: Oct 25, 2011.  INTRODUCTION - Group B streptococcus (GBS) is a bacterium that can cause serious infections in pregnant women and newborn babies. GBS is one of many types of streptococcal bacteria, sometimes called "strep." This article discusses GBS, its effect on pregnant women and infants, and ways to prevent complications of GBS. More detailed information about GBS is available by subscription. (See "Group B streptococcal infection in pregnant women".) WHAT IS GROUP B STREP INFECTION? - GBS is commonly found in the digestive system and the vagina. In healthy adults, GBS is not harmful and does not cause problems. But in pregnant women and newborn infants, being infected with GBS can cause serious illness. Approximately one in three to four pregnant women in the [place] carries GBS in their gastrointestinal system and/or in their vagina. Carrying GBS is not the same as being infected. Carriers are not sick and do not need treatment during pregnancy. There is no treatment that can stop you from carrying GBS.  Pregnant women who are carriers of GBS infrequently become infected with GBS. GBS can cause urinary tract infections, infection of the amniotic fluid (bag of water), and infection of the uterus after delivery. GBS infections during pregnancy may lead to preterm labor.  Pregnant women who carry GBS can pass on the bacteria to their newborns, and some of those babies become infected with GBS. Newborns who are infected with GBS can develop pneumonia (lung infection), septicemia (blood infection), or  meningitis (infection of the lining of the brain and spinal cord). These complications can be prevented by giving intravenous antibiotics during labor to any woman who is at risk of GBS infection. You are at risk of GBS infection if: You have a urine culture during your current pregnancy showing GBS  You have a vaginal and rectal culture during your current pregnancy showing GBS  You had an infant infected with GBS in the past GROUP B STREP PREVENTION - Most doctors and nurses recommend a urine culture early in your pregnancy to be sure that you do not have a bladder infection without symptoms. If you urine culture shows GBS or other bacteria, you may be treated with an antibiotic. If you have symptoms of urinary infection, such as pain with urination, any time during your pregnancy, a urine culture is done. If GBS grows from the urine culture, it should be treated with an antibiotic, and you should also receive intravenous antibiotics during labor. Expert groups recommend that all pregnant women have a GBS culture at 35 to 37 weeks of pregnancy. The culture is done by swabbing the vagina and rectum. If your GBS culture is positive, you will be given an intravenous antibiotic during labor. If you have preterm labor, the culture is done then and an intravenous antibiotic is given until the baby is born or the labor is stopped by your health care provider. If you have a positive GBS culture and you have an allergy to penicillin, be sure your doctor and nurse are aware of this allergy and tell them what happened with the allergy. If you had only a rash or itching, this   is not a serious allergy, and you can receive a common drug related to the penicillin. If you had a serious allergy (for example, trouble breathing, swelling of your face) you may need an additional test to determine which antibiotic should be used during labor. Being treated with an antibiotic during labor greatly reduces the chance that you or  your newborn will develop infections related to GBS. It is important to note that young infants up to age 3 months can also develop septicemia, meningitis and other serious infections from GBS. Being treated with an antibiotic during labor does not reduce the chance that your baby will develop this later type of infection. There is currently no known way of preventing this later-onset GBS disease. WHERE TO GET MORE INFORMATION - Your healthcare provider is the best source of information for questions and concerns related to your medical problem.  

## 2014-01-03 ENCOUNTER — Ambulatory Visit (INDEPENDENT_AMBULATORY_CARE_PROVIDER_SITE_OTHER): Payer: Medicaid Other | Admitting: Obstetrics & Gynecology

## 2014-01-03 ENCOUNTER — Encounter: Payer: Self-pay | Admitting: Obstetrics & Gynecology

## 2014-01-03 VITALS — BP 127/88 | HR 109 | Temp 96.9°F | Wt 201.0 lb

## 2014-01-03 DIAGNOSIS — Z3480 Encounter for supervision of other normal pregnancy, unspecified trimester: Secondary | ICD-10-CM

## 2014-01-03 DIAGNOSIS — Z3483 Encounter for supervision of other normal pregnancy, third trimester: Secondary | ICD-10-CM

## 2014-01-03 LAB — PROTEIN / CREATININE RATIO, URINE
Creatinine, Urine: 60.8 mg/dL
PROTEIN CREATININE RATIO: 0.13 (ref <0.15)
TOTAL PROTEIN, URINE: 8 mg/dL (ref 5–24)

## 2014-01-03 LAB — PATHOLOGIST SMEAR REVIEW

## 2014-01-04 LAB — STREP B DNA PROBE: GBSP: DETECTED

## 2014-01-06 ENCOUNTER — Encounter: Payer: Self-pay | Admitting: Obstetrics & Gynecology

## 2014-01-06 DIAGNOSIS — O99820 Streptococcus B carrier state complicating pregnancy: Secondary | ICD-10-CM | POA: Insufficient documentation

## 2014-01-06 NOTE — Progress Notes (Signed)
Subjective:    Sydney Walters is a 28 y.o. female being seen today for her obstetrical visit. She is at [redacted]w[redacted]d gestation. Patient feels better  Problem List Items Addressed This Visit     High   Supervision of other normal pregnancy - Primary   Relevant Orders      POCT urinalysis dipstick     Patient Active Problem List   Diagnosis Date Noted  . Supervision of other normal pregnancy 08/22/2013    Priority: High  . DEPRESSION 05/19/2010    Priority: High  . ASTHMA 05/19/2010    Priority: High  . Candidiasis of vulva and vagina 12/13/2013  . Acute recurrent frontal sinusitis 11/28/2013  . Unspecified fetal abnormality affecting management of mother, antepartum condition or complication 11/15/2013  . GERD without esophagitis 10/17/2013  . Weight gain 07/02/2013  . Positive pregnancy test 07/02/2013  . Complete spontaneous abortion 07/23/2011  . ALLERGIC RHINITIS 05/19/2010   Objective:    BP 127/88  Pulse 109  Temp(Src) 96.9 F (36.1 C)  Wt 91.173 kg (201 lb)  LMP 04/28/2013 FHT:  140 BPM  Uterine Size: size equals dates  Presentation: unsure     Assessment:    Pregnancy @ [redacted]w[redacted]d weeks .    Plan:      labs reviewed, problem list updated I Orders Placed This Encounter  Procedures  . POCT urinalysis dipstick    Follow up 1 wk

## 2014-01-09 ENCOUNTER — Encounter: Payer: Self-pay | Admitting: Obstetrics & Gynecology

## 2014-01-09 ENCOUNTER — Ambulatory Visit (INDEPENDENT_AMBULATORY_CARE_PROVIDER_SITE_OTHER): Payer: Medicaid Other | Admitting: Obstetrics & Gynecology

## 2014-01-09 VITALS — BP 118/83 | HR 112 | Temp 98.2°F | Wt 204.4 lb

## 2014-01-09 DIAGNOSIS — Z3483 Encounter for supervision of other normal pregnancy, third trimester: Secondary | ICD-10-CM

## 2014-01-09 DIAGNOSIS — Z3480 Encounter for supervision of other normal pregnancy, unspecified trimester: Secondary | ICD-10-CM

## 2014-01-09 NOTE — Patient Instructions (Signed)
Patient information: Group B streptococcus and pregnancy (Beyond the Basics)  Authors Karen M Puopolo, MD, PhD Carol J Baker, MD Section Editors Amrita Radu J Lockwood, MD Daniel J Sexton, MD Deputy Editor Vanessa A Barss, MD Disclosures  All topics are updated as new evidence becomes available and our peer review process is complete.  Literature review current through: Feb 2014.  This topic last updated: Oct 25, 2011.  INTRODUCTION - Group B streptococcus (GBS) is a bacterium that can cause serious infections in pregnant women and newborn babies. GBS is one of many types of streptococcal bacteria, sometimes called "strep." This article discusses GBS, its effect on pregnant women and infants, and ways to prevent complications of GBS. More detailed information about GBS is available by subscription. (See "Group B streptococcal infection in pregnant women".) WHAT IS GROUP B STREP INFECTION? - GBS is commonly found in the digestive system and the vagina. In healthy adults, GBS is not harmful and does not cause problems. But in pregnant women and newborn infants, being infected with GBS can cause serious illness. Approximately one in three to four pregnant women in the [place] carries GBS in their gastrointestinal system and/or in their vagina. Carrying GBS is not the same as being infected. Carriers are not sick and do not need treatment during pregnancy. There is no treatment that can stop you from carrying GBS.  Pregnant women who are carriers of GBS infrequently become infected with GBS. GBS can cause urinary tract infections, infection of the amniotic fluid (bag of water), and infection of the uterus after delivery. GBS infections during pregnancy may lead to preterm labor.  Pregnant women who carry GBS can pass on the bacteria to their newborns, and some of those babies become infected with GBS. Newborns who are infected with GBS can develop pneumonia (lung infection), septicemia (blood infection), or  meningitis (infection of the lining of the brain and spinal cord). These complications can be prevented by giving intravenous antibiotics during labor to any woman who is at risk of GBS infection. You are at risk of GBS infection if: You have a urine culture during your current pregnancy showing GBS  You have a vaginal and rectal culture during your current pregnancy showing GBS  You had an infant infected with GBS in the past GROUP B STREP PREVENTION - Most doctors and nurses recommend a urine culture early in your pregnancy to be sure that you do not have a bladder infection without symptoms. If you urine culture shows GBS or other bacteria, you may be treated with an antibiotic. If you have symptoms of urinary infection, such as pain with urination, any time during your pregnancy, a urine culture is done. If GBS grows from the urine culture, it should be treated with an antibiotic, and you should also receive intravenous antibiotics during labor. Expert groups recommend that all pregnant women have a GBS culture at 35 to 37 weeks of pregnancy. The culture is done by swabbing the vagina and rectum. If your GBS culture is positive, you will be given an intravenous antibiotic during labor. If you have preterm labor, the culture is done then and an intravenous antibiotic is given until the baby is born or the labor is stopped by your health care provider. If you have a positive GBS culture and you have an allergy to penicillin, be sure your doctor and nurse are aware of this allergy and tell them what happened with the allergy. If you had only a rash or itching, this   is not a serious allergy, and you can receive a common drug related to the penicillin. If you had a serious allergy (for example, trouble breathing, swelling of your face) you may need an additional test to determine which antibiotic should be used during labor. Being treated with an antibiotic during labor greatly reduces the chance that you or  your newborn will develop infections related to GBS. It is important to note that young infants up to age 3 months can also develop septicemia, meningitis and other serious infections from GBS. Being treated with an antibiotic during labor does not reduce the chance that your baby will develop this later type of infection. There is currently no known way of preventing this later-onset GBS disease. WHERE TO GET MORE INFORMATION - Your healthcare provider is the best source of information for questions and concerns related to your medical problem.  

## 2014-01-14 NOTE — Progress Notes (Signed)
Subjective:    Sydney Walters is a 28 y.o. female being seen today for her obstetrical visit. She is at [redacted]w[redacted]d gestation.   Problem List Items Addressed This Visit     High   Supervision of other normal pregnancy - Primary   Relevant Orders      POCT urinalysis dipstick     Patient Active Problem List   Diagnosis Date Noted  . Supervision of other normal pregnancy 08/22/2013    Priority: High  . DEPRESSION 05/19/2010    Priority: High  . ASTHMA 05/19/2010    Priority: High  . GBS (group B Streptococcus carrier), +RV culture, currently pregnant 01/06/2014  . Candidiasis of vulva and vagina 12/13/2013  . Acute recurrent frontal sinusitis 11/28/2013  . Unspecified fetal abnormality affecting management of mother, antepartum condition or complication 11/15/2013  . GERD without esophagitis 10/17/2013  . Weight gain 07/02/2013  . Positive pregnancy test 07/02/2013  . Complete spontaneous abortion 07/23/2011  . ALLERGIC RHINITIS 05/19/2010   Objective:    BP 118/83  Pulse 112  Temp(Src) 98.2 F (36.8 C)  Wt 92.715 kg (204 lb 6.4 oz)  PF  L/min  LMP 04/28/2013 FHT:  140 BPM  Uterine Size: size equals dates  Presentation: unsure     Assessment:    Pregnancy @ [redacted]w[redacted]d weeks .    Plan:      labs reviewed, problem list updated I Orders Placed This Encounter  Procedures  . POCT urinalysis dipstick    Follow up 1 wk

## 2014-01-15 LAB — POCT URINALYSIS DIPSTICK
Bilirubin, UA: NEGATIVE
Blood, UA: NEGATIVE
Glucose, UA: NEGATIVE
KETONES UA: NEGATIVE
Leukocytes, UA: NEGATIVE
Nitrite, UA: NEGATIVE
PH UA: 6
PROTEIN UA: NEGATIVE
Spec Grav, UA: 1.015
Urobilinogen, UA: NEGATIVE

## 2014-01-17 ENCOUNTER — Encounter (HOSPITAL_COMMUNITY): Payer: Self-pay | Admitting: *Deleted

## 2014-01-17 ENCOUNTER — Ambulatory Visit (INDEPENDENT_AMBULATORY_CARE_PROVIDER_SITE_OTHER): Payer: Medicaid Other | Admitting: Obstetrics

## 2014-01-17 ENCOUNTER — Inpatient Hospital Stay (HOSPITAL_COMMUNITY)
Admission: AD | Admit: 2014-01-17 | Discharge: 2014-01-17 | Disposition: A | Discharge status: Home / Self Care | Payer: Medicaid Other | Source: Ambulatory Visit | Attending: Obstetrics | Admitting: Obstetrics

## 2014-01-17 ENCOUNTER — Inpatient Hospital Stay (HOSPITAL_COMMUNITY): Payer: Medicaid Other

## 2014-01-17 ENCOUNTER — Encounter: Payer: Self-pay | Admitting: Obstetrics

## 2014-01-17 VITALS — BP 135/92 | HR 102 | Temp 97.9°F | Wt 206.0 lb

## 2014-01-17 DIAGNOSIS — O133 Gestational [pregnancy-induced] hypertension without significant proteinuria, third trimester: Secondary | ICD-10-CM

## 2014-01-17 DIAGNOSIS — Z87891 Personal history of nicotine dependence: Secondary | ICD-10-CM | POA: Insufficient documentation

## 2014-01-17 DIAGNOSIS — Z3480 Encounter for supervision of other normal pregnancy, unspecified trimester: Secondary | ICD-10-CM

## 2014-01-17 DIAGNOSIS — O368390 Maternal care for abnormalities of the fetal heart rate or rhythm, unspecified trimester, not applicable or unspecified: Secondary | ICD-10-CM | POA: Diagnosis not present

## 2014-01-17 DIAGNOSIS — O139 Gestational [pregnancy-induced] hypertension without significant proteinuria, unspecified trimester: Secondary | ICD-10-CM | POA: Insufficient documentation

## 2014-01-17 DIAGNOSIS — IMO0002 Reserved for concepts with insufficient information to code with codable children: Secondary | ICD-10-CM

## 2014-01-17 DIAGNOSIS — Z3483 Encounter for supervision of other normal pregnancy, third trimester: Secondary | ICD-10-CM

## 2014-01-17 LAB — COMPREHENSIVE METABOLIC PANEL
ALT: 17 U/L (ref 0–35)
AST: 22 U/L (ref 0–37)
Albumin: 2.4 g/dL — ABNORMAL LOW (ref 3.5–5.2)
Alkaline Phosphatase: 193 U/L — ABNORMAL HIGH (ref 39–117)
Anion gap: 13 (ref 5–15)
BUN: 9 mg/dL (ref 6–23)
CALCIUM: 9.2 mg/dL (ref 8.4–10.5)
CO2: 22 meq/L (ref 19–32)
CREATININE: 0.65 mg/dL (ref 0.50–1.10)
Chloride: 101 mEq/L (ref 96–112)
GLUCOSE: 89 mg/dL (ref 70–99)
Potassium: 4 mEq/L (ref 3.7–5.3)
Sodium: 136 mEq/L — ABNORMAL LOW (ref 137–147)
Total Bilirubin: 0.2 mg/dL — ABNORMAL LOW (ref 0.3–1.2)
Total Protein: 6.6 g/dL (ref 6.0–8.3)

## 2014-01-17 LAB — PROTEIN / CREATININE RATIO, URINE
CREATININE, URINE: 77.73 mg/dL
Protein Creatinine Ratio: 0.13 (ref 0.00–0.15)
Total Protein, Urine: 9.8 mg/dL

## 2014-01-17 LAB — POCT URINALYSIS DIPSTICK
BILIRUBIN UA: NEGATIVE
Glucose, UA: NEGATIVE
Ketones, UA: NEGATIVE
Leukocytes, UA: NEGATIVE
Nitrite, UA: NEGATIVE
Protein, UA: NEGATIVE
RBC UA: NEGATIVE
Spec Grav, UA: 1.01
Urobilinogen, UA: NEGATIVE
pH, UA: 7

## 2014-01-17 LAB — URINALYSIS, ROUTINE W REFLEX MICROSCOPIC
Bilirubin Urine: NEGATIVE
Glucose, UA: NEGATIVE mg/dL
Ketones, ur: NEGATIVE mg/dL
Nitrite: NEGATIVE
PH: 7 (ref 5.0–8.0)
Protein, ur: NEGATIVE mg/dL
SPECIFIC GRAVITY, URINE: 1.01 (ref 1.005–1.030)
Urobilinogen, UA: 0.2 mg/dL (ref 0.0–1.0)

## 2014-01-17 LAB — CBC
HEMATOCRIT: 37.1 % (ref 36.0–46.0)
HEMOGLOBIN: 12.2 g/dL (ref 12.0–15.0)
MCH: 29.2 pg (ref 26.0–34.0)
MCHC: 32.9 g/dL (ref 30.0–36.0)
MCV: 88.8 fL (ref 78.0–100.0)
Platelets: 201 10*3/uL (ref 150–400)
RBC: 4.18 MIL/uL (ref 3.87–5.11)
RDW: 12.7 % (ref 11.5–15.5)
WBC: 9.9 10*3/uL (ref 4.0–10.5)

## 2014-01-17 LAB — URINE MICROSCOPIC-ADD ON

## 2014-01-17 NOTE — MAU Note (Signed)
Sent from office for BP evaluation. Denies HA, has had some blurring- but not all the time, + edema in feet. No bleeding or leaking.

## 2014-01-17 NOTE — MAU Provider Note (Signed)
Chief Complaint:  Hypertension   First Provider Initiated Contact with Patient 01/17/14 1025      HPI: Sydney Walters is a 28 y.o. [id number] at [redacted]w[redacted]d who was sent to maternity admissions from [facility] for pre-eclampsia labwork. BP 135/92. Denies HA, vision changes, epigastric pain, contractions, leakage of fluid or vaginal bleeding. Good fetal movement.   Pregnancy Course: Uncomplicated.   Past Medical History: Past Medical History  Diagnosis Date  . Asthma   . Depression     hx of med usuage, doing ok currently  . Abnormal Pap smear     bx and follow up were normal  . Hypoglycemia   . Allergy     seasonal  . Anxiety   . IBS (irritable bowel syndrome)     Past obstetric history: OB History  Gravida Para Term Preterm AB SAB TAB Ectopic Multiple Living  0 0 0 1    # Outcome Date GA Lbr Len/2nd Weight Sex Delivery Anes PTL Lv  4 CUR           3 SAB 2012             Comments: System Generated. Please review and update pregnancy details.  2 TAB 2010          1 TRM 03/17/05 [redacted]w[redacted]d  3.771 kg (8 lb 5 oz) M SVD Other N Y      Past Surgical History: Past Surgical History  Procedure Laterality Date  . Induced abortion    . Eye surgery  05/01/2010    lasik      Family History: Family History  Problem Relation Age of Onset  . Coronary artery disease Other   . Thyroid disease Other   . Stroke Other   . Anesthesia problems Neg Hx   . Depression Mother   . Carpal tunnel syndrome Mother   . Anxiety disorder Mother   . Other      hypoglycemia  . Graves' disease Father   . Hypertension Father   . Mitral valve prolapse Father   . Heart disease Father     Congestive heart disease  . Diabetes Father     Social History: History  Substance Use Topics  . Smoking status: Former Smoker    Quit date: 06/24/2008  . Smokeless tobacco: Never Used  . Alcohol Use: No    Allergies:  Allergies  Allergen Reactions  . Mushroom Extract Complex Anaphylaxis    Meds:   Prescriptions prior to admission  Medication Sig Dispense Refill  . Doxylamine-Pyridoxine 10-10 MG TBEC Take 2 tablets by mouth at bedtime.  60 tablet  4  . fexofenadine-pseudoephedrine (ALLEGRA-D 24) 180-240 MG per 24 hr tablet Take 1 tablet by mouth daily.      . montelukast (SINGULAIR) 10 MG tablet Take 1 tablet (10 mg total) by mouth at bedtime.  30 tablet  11  . omeprazole (PRILOSEC) 20 MG capsule Take 1 capsule (20 mg total) by mouth 2 (two) times daily before a meal.  60 capsule  5  . Prenatal Multivit-Min-Fe-FA (PRENATAL VITAMINS PO) Take 1 tablet by mouth.        ROS: Pertinent findings in history of present illness.  Physical Exam  Blood pressure 133/87, pulse 81, height  (1.651 m), weight 93.441 kg (206 lb), last menstrual period 04/28/2013, SpO2 99.00%. Patient Vitals for the past 24 hrs:  BP Pulse SpO2 Height Weight  01/17/14 1335 - 103 100 % - -  01/17/14 1331 - 93 - - -  01/17/14 1330 119/75 mmHg 88 100 % - -  01/17/14 1325 - 91 100 % - -  01/17/14 1222 - 84 99 % - -  01/17/14 1217 - 97 100 % - -  01/17/14 1215 130/82 mmHg 96 - - -  01/17/14 1212 - 86 99 % - -  01/17/14 1207 - 84 99 % - -  01/17/14 1202 - 94 100 % - -  01/17/14 1200 133/88 mmHg 81 - - -  01/17/14 1157 - 89 99 % - -  01/17/14 1152 - 85 100 % - -  01/17/14 1149 133/87 mmHg 81 - - -  01/17/14 1147 - 78 99 % - -  01/17/14 1142 - 105 97 % - -  01/17/14 1137 - 87 98 % - -  01/17/14 1132 - 100 98 % - -  01/17/14 1130 108/70 mmHg 95 - - -  01/17/14 1127 - 78 99 % - -  01/17/14 1122 - 95 99 % - -  01/17/14 1117 - 86 98 % - -  01/17/14 1115 116/75 mmHg 89 - - -  01/17/14 1112 - 106 98 % - -  01/17/14 1107 - 103 97 % - -  01/17/14 1100 127/85 mmHg 90 - - -  01/17/14 1045 125/87 mmHg 88 - - -  01/17/14 1031 123/88 mmHg 98 - - -  01/17/14 1022 132/83 mmHg 93 - - -  01/17/14 1020 132/83 mmHg 93 -  (1.651 m) 93.441 kg (206 lb)    GENERAL: Well-developed, well-nourished female in no acute  distress.  HEENT: normocephalic HEART: normal rate RESP: normal effort ABDOMEN: Soft, non-tender, gravid appropriate for gestational age EXTREMITIES: Nontender, 1+ pedal edema NEURO: alert and oriented. DTRs 2+, no clonus SPECULUM EXAM: deferred     FHT:  Baseline 150 , moderate variability, accelerations present, no decelerations Contractions: Irreg, painless.   Labs: Results for orders placed during the hospital encounter of 01/17/14 (from the past 24 hour(s))  URINALYSIS, ROUTINE W REFLEX MICROSCOPIC     Status: Abnormal   Collection Time    01/17/14 10:05 AM      Result Value Ref Range   Color, Urine YELLOW  YELLOW   APPearance CLEAR  CLEAR   Specific Gravity, Urine 1.010  1.005 - 1.030   pH 7.0  5.0 - 8.0   Glucose, UA NEGATIVE  NEGATIVE mg/dL   Hgb urine dipstick SMALL (*) NEGATIVE   Bilirubin Urine NEGATIVE  NEGATIVE   Ketones, ur NEGATIVE  NEGATIVE mg/dL   Protein, ur NEGATIVE  NEGATIVE mg/dL   Urobilinogen, UA 0.2  0.0 - 1.0 mg/dL   Nitrite NEGATIVE  NEGATIVE   Leukocytes, UA SMALL (*) NEGATIVE  PROTEIN / CREATININE RATIO, URINE     Status: None   Collection Time    01/17/14 10:05 AM      Result Value Ref Range   Creatinine, Urine 77.73     Total Protein, Urine 9.8     Protein Creatinine Ratio 0.13  0.00 - 0.15  URINE MICROSCOPIC-ADD ON     Status: None   Collection Time    01/17/14 10:05 AM      Result Value Ref Range   Squamous Epithelial / LPF RARE  RARE   WBC, UA 3-6  <3 WBC/hpf   RBC / HPF 0-2  <3 RBC/hpf   Bacteria, UA RARE  RARE  CBC     Status: None   Collection  Time    01/17/14 10:07 AM      Result Value Ref Range   WBC 9.9  4.0 - 10.5 K/uL   RBC 4.18  3.87 - 5.11 MIL/uL   Hemoglobin 12.2  12.0 - 15.0 g/dL   HCT 16.1  09.6 - 04.5 %   MCV 88.8  78.0 - 100.0 fL   MCH 29.2  26.0 - 34.0 pg   MCHC 32.9  30.0 - 36.0 g/dL   RDW 40.9  81.1 - 91.4 %   Platelets 201  150 - 400 K/uL  COMPREHENSIVE METABOLIC PANEL     Status: Abnormal   Collection  Time    01/17/14 10:07 AM      Result Value Ref Range   Sodium 136 (*) 137 - 147 mEq/L   Potassium 4.0  3.7 - 5.3 mEq/L   Chloride 101  96 - 112 mEq/L   CO2 22  19 - 32 mEq/L   Glucose, Bld 89  70 - 99 mg/dL   BUN 9  6 - 23 mg/dL   Creatinine, Ser 7.82  0.50 - 1.10 mg/dL   Calcium 9.2  8.4 - 95.6 mg/dL   Total Protein 6.6  6.0 - 8.3 g/dL   Albumin 2.4 (*) 3.5 - 5.2 g/dL   AST 22  0 - 37 U/L   ALT 17  0 - 35 U/L   Alkaline Phosphatase 193 (*) 39 - 117 U/L   Total Bilirubin 0.2 (*) 0.3 - 1.2 mg/dL   GFR calc non Af Amer >90  >90 mL/min   GFR calc Af Amer >90  >90 mL/min   Anion gap 13  5 - 15    Imaging:  BPP 6/8. 2 off for no FBM. AFI 14 cm.    MAU Course: BPP ordered due to decel.  FHR category 1 x 1 hour after decel. Discussed BP, labwork, decel, BPP w/ Dr. Clearance Coots. D/C pt w/ HST scheduled in 4 days.   Assessment: 1. Transient hypertension of pregnancy, antepartum, third trimester   2. Fetal heart deceleration     Plan: Discharge home per consult w/ Dr. Clearance Coots. Pre-E precautions. Labor precautions and fetal kick counts Follow-up Information   Follow up with HARPER,CHARLES A, MD On 01/21/2014.   Specialty:  Obstetrics and Gynecology   Contact information:   [address] [place] [address] [phone]       Follow up with [facility] [place] MATERN[facility] ADMISSIONS. (As needed in emergencies)    Contact information:   [address] [id number] [place] [address] [phone]       Medication List         Doxylamine-Pyridoxine 10-10 MG Tbec  Take 2 tablets by mouth at bedtime.     fexofenadine-pseudoephedrine 180-240 MG per 24 hr tablet  Commonly known as:  ALLEGRA-D 24  Take 1 tablet by mouth daily.     montelukast 10 MG tablet  Commonly known as:  SINGULAIR  Take 1 tablet (10 mg total) by mouth at bedtime.     omeprazole 20 MG capsule  Commonly known as:  PRILOSEC  Take 1 capsule (20 mg total) by  mouth 2 (two) times daily before a meal.     PRENATAL VITAMINS PO  Take 1 tablet by mouth.        Christopher [place], CNM 01/17/2014 1:38 PM

## 2014-01-17 NOTE — Discharge Instructions (Signed)

## 2014-01-17 NOTE — Progress Notes (Signed)
Subjective:    Sydney Walters is a 28 y.o. female being seen today for her obstetrical visit. She is at [redacted]w[redacted]d gestation. Patient reports no complaints. Fetal movement: normal.  Problem List Items Addressed This Visit   None    Visit Diagnoses   Encounter for supervision of other normal pregnancy in third trimester    -  Primary    Relevant Orders       POCT urinalysis dipstick (Completed)      Patient Active Problem List   Diagnosis Date Noted  . GBS (group B Streptococcus carrier), +RV culture, currently pregnant 01/06/2014  . Candidiasis of vulva and vagina 12/13/2013  . Acute recurrent frontal sinusitis 11/28/2013  . Unspecified fetal abnormality affecting management of mother, antepartum condition or complication 11/15/2013  . GERD without esophagitis 10/17/2013  . Supervision of other normal pregnancy 08/22/2013  . Weight gain 07/02/2013  . Positive pregnancy test 07/02/2013  . Complete spontaneous abortion 07/23/2011  . DEPRESSION 05/19/2010  . ALLERGIC RHINITIS 05/19/2010  . ASTHMA 05/19/2010    Objective:    BP 135/92  Pulse 102  Temp(Src) 97.9 F (36.6 C)  Wt 206 lb (93.441 kg)  LMP 04/28/2013 FHT: 140 BPM  Uterine Size: size equals dates  Presentations: unsure  Pelvic Exam: Deferred    Assessment:    Pregnancy @ [redacted]w[redacted]d weeks   Elevated BP  Plan:   Plans for delivery: Vaginal anticipated; labs reviewed; problem list updated. Counseling: Consent signed. Infant feeding: plans to breastfeed. Cigarette smoking: quit 2010. L&D discussion: symptoms of labor, discussed when to call, discussed what number to call, anesthetic/analgesic options reviewed and delivering clinician:  plans Physician. Postpartum supports and preparation: circumcision discussed and contraception plans discussed.  Sent to [facility] for PIH work up.  Follow up in 1 Week.

## 2014-01-17 NOTE — MAU Note (Signed)
Pt was at office this morning and was sent over for high blood pressure.

## 2014-01-21 ENCOUNTER — Encounter: Payer: Self-pay | Admitting: Obstetrics

## 2014-01-21 ENCOUNTER — Ambulatory Visit (INDEPENDENT_AMBULATORY_CARE_PROVIDER_SITE_OTHER): Payer: Medicaid Other | Admitting: Obstetrics

## 2014-01-21 VITALS — BP 127/91 | HR 103 | Temp 97.0°F | Wt 204.0 lb

## 2014-01-21 DIAGNOSIS — Z3483 Encounter for supervision of other normal pregnancy, third trimester: Secondary | ICD-10-CM

## 2014-01-21 DIAGNOSIS — R52 Pain, unspecified: Secondary | ICD-10-CM

## 2014-01-21 DIAGNOSIS — Z3480 Encounter for supervision of other normal pregnancy, unspecified trimester: Secondary | ICD-10-CM

## 2014-01-21 MED ORDER — OXYCODONE HCL 10 MG PO TABS: 10.0000 mg | ORAL_TABLET | Freq: Four times a day (QID) | ORAL | Status: DC | PRN

## 2014-01-21 NOTE — Progress Notes (Signed)
Subjective:    Sydney Walters is a 28 y.o. female being seen today for her obstetrical visit. She is at [redacted]w[redacted]d gestation. Patient reports no complaints. Fetal movement: normal.  Problem List Items Addressed This Visit   Supervision of other normal pregnancy - Primary   Relevant Orders      POCT urinalysis dipstick    Other Visit Diagnoses   Pain aggravated by activities of daily living        Relevant Medications       Oxycodone HCl 10 MG TABS      Patient Active Problem List   Diagnosis Date Noted  . GBS (group B Streptococcus carrier), +RV culture, currently pregnant 01/06/2014  . Candidiasis of vulva and vagina 12/13/2013  . Acute recurrent frontal sinusitis 11/28/2013  . Unspecified fetal abnormality affecting management of mother, antepartum condition or complication 11/15/2013  . GERD without esophagitis 10/17/2013  . Supervision of other normal pregnancy 08/22/2013  . Weight gain 07/02/2013  . Positive pregnancy test 07/02/2013  . Complete spontaneous abortion 07/23/2011  . DEPRESSION 05/19/2010  . ALLERGIC RHINITIS 05/19/2010  . ASTHMA 05/19/2010    Objective:    BP 127/91  Pulse 103  Temp(Src) 97 F (36.1 C)  Wt 204 lb (92.534 kg)  LMP 04/28/2013 FHT: 140 BPM  Uterine Size: size greater than dates  Presentations: unsure  Pelvic Exam: Deferred    Assessment:    Pregnancy @ [redacted]w[redacted]d weeks   Plan:   Plans for delivery: Vaginal anticipated; labs reviewed; problem list updated Counseling: Consent signed. Infant feeding: plans to breastfeed. Cigarette smoking: quit 2010. L&D discussion: symptoms of labor, discussed when to call, discussed what number to call, anesthetic/analgesic options reviewed and delivering clinician:  plans Physician. Postpartum supports and preparation: circumcision discussed and contraception plans discussed.  Follow up in 1 Week.

## 2014-01-23 ENCOUNTER — Encounter (HOSPITAL_COMMUNITY): Payer: Self-pay | Admitting: *Deleted

## 2014-01-23 ENCOUNTER — Inpatient Hospital Stay (HOSPITAL_COMMUNITY)
Admission: AD | Admit: 2014-01-23 | Discharge: 2014-01-25 | DRG: 775 | Disposition: A | Discharge status: Home / Self Care | Payer: Medicaid Other | Source: Ambulatory Visit | Attending: Obstetrics | Admitting: Obstetrics

## 2014-01-23 ENCOUNTER — Encounter: Payer: Medicaid Other | Admitting: Obstetrics & Gynecology

## 2014-01-23 DIAGNOSIS — K589 Irritable bowel syndrome without diarrhea: Secondary | ICD-10-CM | POA: Diagnosis present

## 2014-01-23 DIAGNOSIS — O99820 Streptococcus B carrier state complicating pregnancy: Secondary | ICD-10-CM

## 2014-01-23 DIAGNOSIS — Z8249 Family history of ischemic heart disease and other diseases of the circulatory system: Secondary | ICD-10-CM

## 2014-01-23 DIAGNOSIS — O99343 Other mental disorders complicating pregnancy, third trimester: Secondary | ICD-10-CM | POA: Diagnosis present

## 2014-01-23 DIAGNOSIS — F418 Other specified anxiety disorders: Secondary | ICD-10-CM | POA: Diagnosis present

## 2014-01-23 DIAGNOSIS — Z833 Family history of diabetes mellitus: Secondary | ICD-10-CM | POA: Diagnosis not present

## 2014-01-23 DIAGNOSIS — Z87891 Personal history of nicotine dependence: Secondary | ICD-10-CM | POA: Diagnosis not present

## 2014-01-23 DIAGNOSIS — Z823 Family history of stroke: Secondary | ICD-10-CM

## 2014-01-23 DIAGNOSIS — O471 False labor at or after 37 completed weeks of gestation: Secondary | ICD-10-CM | POA: Diagnosis present

## 2014-01-23 LAB — POCT FERN TEST: POCT FERN TEST: POSITIVE

## 2014-01-23 LAB — HIV ANTIBODY (ROUTINE TESTING W REFLEX): HIV 1&2 Ab, 4th Generation: NONREACTIVE

## 2014-01-23 LAB — POCT URINALYSIS DIPSTICK
Bilirubin, UA: NEGATIVE
GLUCOSE UA: NEGATIVE
Ketones, UA: NEGATIVE
Nitrite, UA: NEGATIVE
Spec Grav, UA: 1.01
UROBILINOGEN UA: NEGATIVE
pH, UA: 7

## 2014-01-23 LAB — CBC
HCT: 37.6 % (ref 36.0–46.0)
Hemoglobin: 12.8 g/dL (ref 12.0–15.0)
MCH: 29.8 pg (ref 26.0–34.0)
MCHC: 34 g/dL (ref 30.0–36.0)
MCV: 87.6 fL (ref 78.0–100.0)
PLATELETS: 210 10*3/uL (ref 150–400)
RBC: 4.29 MIL/uL (ref 3.87–5.11)
RDW: 12.9 % (ref 11.5–15.5)
WBC: 10.7 10*3/uL — AB (ref 4.0–10.5)

## 2014-01-23 LAB — RPR

## 2014-01-23 MED ORDER — WITCH HAZEL-GLYCERIN EX PADS: 1.0000 "application " | MEDICATED_PAD | CUTANEOUS | Status: DC | PRN

## 2014-01-23 MED ORDER — LIDOCAINE HCL (PF) 1 % IJ SOLN: 30.0000 mL | INTRAMUSCULAR | Status: DC | PRN

## 2014-01-23 MED ORDER — LACTATED RINGERS IV SOLN
INTRAVENOUS | Status: DC
Start: 2014-01-23 — End: 2014-01-23

## 2014-01-23 MED ORDER — FLEET ENEMA 7-19 GM/118ML RE ENEM: 1.0000 | ENEMA | RECTAL | Status: DC | PRN

## 2014-01-23 MED ORDER — OXYCODONE-ACETAMINOPHEN 5-325 MG PO TABS
1.0000 | ORAL_TABLET | ORAL | Status: DC | PRN
Start: 2014-01-23 — End: 2014-01-23
  Administered 2014-01-23: 1 via ORAL
  Filled 2014-01-23: qty 1

## 2014-01-23 MED ORDER — INFLUENZA VAC SPLIT QUAD 0.5 ML IM SUSY
0.5000 mL | PREFILLED_SYRINGE | INTRAMUSCULAR | Status: AC
  Administered 2014-01-24: 0.5 mL via INTRAMUSCULAR

## 2014-01-23 MED ORDER — LACTATED RINGERS IV SOLN: 500.0000 mL | INTRAVENOUS | Status: DC | PRN

## 2014-01-23 MED ORDER — PRENATAL MULTIVITAMIN CH
1.0000 | ORAL_TABLET | Freq: Every day | ORAL | Status: DC
  Administered 2014-01-23 – 2014-01-25 (×3): 1 via ORAL
  Filled 2014-01-23 (×3): qty 1

## 2014-01-23 MED ORDER — BENZOCAINE-MENTHOL 20-0.5 % EX AERO
1.0000 "application " | INHALATION_SPRAY | CUTANEOUS | Status: DC | PRN
  Administered 2014-01-23: 1 via TOPICAL
  Filled 2014-01-23 (×2): qty 56

## 2014-01-23 MED ORDER — BUTORPHANOL TARTRATE 1 MG/ML IJ SOLN: 1.0000 mg | INTRAMUSCULAR | Status: DC | PRN

## 2014-01-23 MED ORDER — ONDANSETRON HCL 4 MG PO TABS: 4.0000 mg | ORAL_TABLET | ORAL | Status: DC | PRN

## 2014-01-23 MED ORDER — PNEUMOCOCCAL VAC POLYVALENT 25 MCG/0.5ML IJ INJ
0.5000 mL | INJECTION | INTRAMUSCULAR | Status: AC
  Administered 2014-01-24: 0.5 mL via INTRAMUSCULAR
  Filled 2014-01-23: qty 0.5

## 2014-01-23 MED ORDER — OXYTOCIN BOLUS FROM INFUSION: 500.0000 mL | INTRAVENOUS | Status: DC

## 2014-01-23 MED ORDER — ONDANSETRON HCL 4 MG/2ML IJ SOLN: 4.0000 mg | INTRAMUSCULAR | Status: DC | PRN

## 2014-01-23 MED ORDER — OXYTOCIN 40 UNITS IN LACTATED RINGERS INFUSION - SIMPLE MED
INTRAVENOUS | Status: AC
  Filled 2014-01-23: qty 1000

## 2014-01-23 MED ORDER — PENICILLIN G POTASSIUM 5000000 UNITS IJ SOLR
5.0000 10*6.[IU] | Freq: Once | INTRAMUSCULAR | Status: DC
  Filled 2014-01-23: qty 5

## 2014-01-23 MED ORDER — IBUPROFEN 600 MG PO TABS
600.0000 mg | ORAL_TABLET | Freq: Four times a day (QID) | ORAL | Status: DC
  Administered 2014-01-23 – 2014-01-25 (×10): 600 mg via ORAL
  Filled 2014-01-23 (×10): qty 1

## 2014-01-23 MED ORDER — DIBUCAINE 1 % RE OINT
1.0000 "application " | TOPICAL_OINTMENT | RECTAL | Status: DC | PRN
  Filled 2014-01-23: qty 28

## 2014-01-23 MED ORDER — DIPHENHYDRAMINE HCL 25 MG PO CAPS: 25.0000 mg | ORAL_CAPSULE | Freq: Four times a day (QID) | ORAL | Status: DC | PRN

## 2014-01-23 MED ORDER — ONDANSETRON HCL 4 MG/2ML IJ SOLN: 4.0000 mg | Freq: Four times a day (QID) | INTRAMUSCULAR | Status: DC | PRN

## 2014-01-23 MED ORDER — PENICILLIN G POTASSIUM 5000000 UNITS IJ SOLR
2.5000 10*6.[IU] | INTRAVENOUS | Status: DC
  Filled 2014-01-23 (×2): qty 2.5

## 2014-01-23 MED ORDER — OXYCODONE-ACETAMINOPHEN 5-325 MG PO TABS: 2.0000 | ORAL_TABLET | ORAL | Status: DC | PRN

## 2014-01-23 MED ORDER — LIDOCAINE HCL (PF) 1 % IJ SOLN
INTRAMUSCULAR | Status: DC
Start: 2014-01-23 — End: 2014-01-23
  Filled 2014-01-23: qty 30

## 2014-01-23 MED ORDER — ZOLPIDEM TARTRATE 5 MG PO TABS: 5.0000 mg | ORAL_TABLET | Freq: Every evening | ORAL | Status: DC | PRN

## 2014-01-23 MED ORDER — CITRIC ACID-SODIUM CITRATE 334-500 MG/5ML PO SOLN: 30.0000 mL | ORAL | Status: DC | PRN

## 2014-01-23 MED ORDER — OXYTOCIN 40 UNITS IN LACTATED RINGERS INFUSION - SIMPLE MED: 62.5000 mL/h | INTRAVENOUS | Status: DC

## 2014-01-23 MED ORDER — OXYTOCIN 40 UNITS IN LACTATED RINGERS INFUSION - SIMPLE MED: 62.5000 mL/h | INTRAVENOUS | Status: DC | PRN

## 2014-01-23 MED ORDER — SENNOSIDES-DOCUSATE SODIUM 8.6-50 MG PO TABS
2.0000 | ORAL_TABLET | ORAL | Status: DC
  Administered 2014-01-24 (×2): 2 via ORAL
  Filled 2014-01-23 (×2): qty 2

## 2014-01-23 MED ORDER — ACETAMINOPHEN 325 MG PO TABS: 650.0000 mg | ORAL_TABLET | ORAL | Status: DC | PRN

## 2014-01-23 MED ORDER — SIMETHICONE 80 MG PO CHEW
80.0000 mg | CHEWABLE_TABLET | ORAL | Status: DC | PRN
Start: 2014-01-23 — End: 2014-01-25

## 2014-01-23 MED ORDER — OXYCODONE-ACETAMINOPHEN 5-325 MG PO TABS
1.0000 | ORAL_TABLET | ORAL | Status: DC | PRN
  Administered 2014-01-23: 1 via ORAL
  Filled 2014-01-23: qty 1

## 2014-01-23 MED ORDER — TETANUS-DIPHTH-ACELL PERTUSSIS 5-2.5-18.5 LF-MCG/0.5 IM SUSP
0.5000 mL | Freq: Once | INTRAMUSCULAR | Status: AC
  Administered 2014-01-24: 0.5 mL via INTRAMUSCULAR

## 2014-01-23 MED ORDER — LANOLIN HYDROUS EX OINT: TOPICAL_OINTMENT | CUTANEOUS | Status: DC | PRN

## 2014-01-23 NOTE — Progress Notes (Signed)
Dr Jean RosenthalJackson Christell Constant-Moore notified of patient wanting to take Allegra and Singular hs.  Orders received

## 2014-01-23 NOTE — Progress Notes (Signed)
Report called to Aleta Harper, RN (charge RN) in [facility]. NST reviewed with Clearance CootsHarper, Charity fundraiserN. Pt to transfer to room 173.

## 2014-01-23 NOTE — Lactation Note (Signed)
This note was copied from the chart of Sydney Novella RobRenee Kierstead. Lactation Consultation Note Initial visit at 11 hours of age.  Mom reports a few good feedings, but baby is sleepy and not opening wide for latch.  Mom has attempted cross cradle hold.  Encouraged to call RN for assist, baby is now STS asleep on moms chest.  Mom is able to hand express colostrum and tries to offer to baby at feedings.  Mom will have to return to school in a few weeks, discussed options for pumping her one day a week she is in class.  [facility]  [facility] resources given and discussed.  Encouraged to feed with early cues on demand.  Early newborn behavior discussed.  Mom to call for assist as needed.      Patient Name: Sydney Walters ZOXWR'UToday's Date: 01/23/2014 Reason for consult: Initial assessment   Maternal Data Has patient been taught Hand Expression?: Yes Does the patient have breastfeeding experience prior to this delivery?: Yes  Feeding Feeding Type: Breast Fed Length of feed: 15 min  LATCH Score/Interventions                Intervention(s): Breastfeeding basics reviewed     Lactation Tools Discussed/Used     Consult Status Consult Status: Follow-up Date: 01/24/14 Follow-up type: In-patient    Jannifer RodneyShoptaw, Lynanne Delgreco Lynn 01/23/2014, 4:42 PM

## 2014-01-23 NOTE — H&P (Signed)
Sydney Walters is a 28 y.o. female presenting for UC's and SROM. Maternal Medical History:  Reason for admission: Rupture of membranes and contractions.   Fetal activity: Perceived fetal activity is normal.    Prenatal Complications - Diabetes: none.    OB History   Grav Para Term Preterm Abortions TAB SAB Ect Mult Living   4 1 1  0 2 1 1  0 0 1     Past Medical History  Diagnosis Date  . Asthma   . Depression     hx of med usuage, doing ok currently  . Abnormal Pap smear     bx and follow up were normal  . Hypoglycemia   . Allergy     seasonal  . Anxiety   . IBS (irritable bowel syndrome)    Past Surgical History  Procedure Laterality Date  . Induced abortion    . Eye surgery  05/01/2010    lasik    Family History: family history includes Anxiety disorder in her mother; Carpal tunnel syndrome in her mother; Coronary artery disease in her other; Depression in her mother; Diabetes in her father; Luiz BlareGraves' disease in her father; Heart disease in her father; Hypertension in her father; Mitral valve prolapse in her father; Other in an other family member; Stroke in her other; Thyroid disease in her other. There is no history of Anesthesia problems. Social History:  reports that she quit smoking about 5 years ago. She has never used smokeless tobacco. She reports that she does not drink alcohol or use illicit drugs.   Prenatal Transfer Tool  Maternal Diabetes: No Genetic Screening: Normal Maternal Ultrasounds/Referrals: Normal Fetal Ultrasounds or other Referrals:  None Maternal Substance Abuse:  No Significant Maternal Medications:  None Significant Maternal Lab Results:  None Other Comments:  None  Review of Systems  All other systems reviewed and are negative.   Dilation: 4 Effacement (%): 80 Station: -1 Exam by:: Restaurant manager, fast foodDunbar RN Blood pressure 147/97, pulse 104, temperature 98.1 F (36.7 C), temperature source Oral, resp. rate 18, last menstrual period  04/28/2013. Maternal Exam:  Uterine Assessment: Contraction strength is moderate.  Contraction frequency is regular.   Abdomen: Patient reports no abdominal tenderness. Pelvis: adequate for delivery.   Cervix: Cervix evaluated by digital exam.     Physical Exam  Constitutional: She is oriented to person, place, and time. She appears well-developed and well-nourished.  HENT:  Head: Normocephalic and atraumatic.  Eyes: Conjunctivae are normal. Pupils are equal, round, and reactive to light.  Neck: Normal range of motion. Neck supple.  Cardiovascular: Normal rate and regular rhythm.   Respiratory: Effort normal.  GI: Soft.  Neurological: She is alert and oriented to person, place, and time.  Skin: Skin is warm and dry.  Psychiatric: She has a normal mood and affect. Her behavior is normal. Judgment and thought content normal.    Prenatal labs: ABO, Rh: O/POS/-- (04/29 1510) Antibody: NEG (04/29 1510) Rubella: 0.40 (04/29 1510) RPR: NON REAC (06/24 1132)  HBsAg: NEGATIVE (04/29 1510)  HIV: NONREACTIVE (06/24 1132)  GBS: Detected (09/09 1205)   Assessment/Plan: 39 weeks.  Active labor.  Admit.   Shahan Starks A 01/23/2014, 5:54 AM

## 2014-01-23 NOTE — MAU Note (Signed)
Pt states that "water broke" at 3am and has been having contractions since 320. Positive fetal movement. No vaginal bleeding. Denies any risk factors except increased BP's.

## 2014-01-23 NOTE — Progress Notes (Signed)
UR completed 

## 2014-01-24 LAB — CBC
HCT: 37.4 % (ref 36.0–46.0)
Hemoglobin: 12.2 g/dL (ref 12.0–15.0)
MCH: 29.2 pg (ref 26.0–34.0)
MCHC: 32.6 g/dL (ref 30.0–36.0)
MCV: 89.5 fL (ref 78.0–100.0)
PLATELETS: 196 10*3/uL (ref 150–400)
RBC: 4.18 MIL/uL (ref 3.87–5.11)
RDW: 12.9 % (ref 11.5–15.5)
WBC: 12.3 10*3/uL — AB (ref 4.0–10.5)

## 2014-01-24 MED ORDER — MONTELUKAST SODIUM 10 MG PO TABS
10.0000 mg | ORAL_TABLET | Freq: Every day | ORAL | Status: DC
  Administered 2014-01-24 (×2): 10 mg via ORAL
  Filled 2014-01-24 (×2): qty 1

## 2014-01-24 MED ORDER — LORATADINE 10 MG PO TABS
10.0000 mg | ORAL_TABLET | Freq: Every day | ORAL | Status: DC
  Administered 2014-01-24 (×2): 10 mg via ORAL
  Filled 2014-01-24 (×2): qty 1

## 2014-01-24 NOTE — Progress Notes (Signed)
Clinical Social Work Department PSYCHOSOCIAL ASSESSMENT - MATERNAL/CHILD 01/24/2014  Patient:  Sydney Walters,Sydney Walters  Account Number:  [id number]  Admit Date:  01/23/2014  Childs Name:   Sydney Walters   Clinical Social Worker:  Jeanny Rymer, CLINICAL SOCIAL WORKER   Date/Time:  01/24/2014 09:00 AM  Date Referred:  01/23/2014   Referral source  Central Nursery     Referred reason  Depression/Anxiety   Other referral source:    I:  FAMILY / HOME ENVIRONMENT Child's legal guardian:  PARENT  Guardian - Name Guardian - Age Guardian - Address  Sydney Walters 28 [address] [place], [place] [address]  Sydney Walters  same as above   Other household support members/support persons Name Relationship DOB  Matthew SON 8 years old   Other support:   MOB stated that her family lives about an hour away,  but that they are supportive.  She stated that her godmother lives nearby and is supportive and actively involved in providing support.    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  FOB works for [employer] and stated that they are flexible with his need to be home with the baby.   Financial resources:  Medicaid If Medicaid - County:  [place]  School / Grade:  MSW at [employer]. Maternity Care Coordinator / Child Services Coordination / Early Interventions:   None reported  Cultural issues impacting care:   None reported    III  STRENGTHS Strengths  Adequate Resources  Home prepared for Child (including basic supplies)  Supportive family/friends   Strength comment:    IV  RISK FACTORS AND CURRENT PROBLEMS Current Problem:  YES   Risk Factor & Current Problem Patient Issue Family Issue Risk Factor / Current Problem Comment  Mental Illness Y N MOB presents with a mental health history  signficiant for depression and anxiety. She was prescribed Zoloft prior to her pregnancy. MOB endorsed anxiety during her pregnancy secondary to multiple stressors.    V  SOCIAL WORK ASSESSMENT CSW met with the MOB in her room in order to complete the assessment. Consult was ordered due to MOB presenting with a mental health history significant for depression and anxiety.  MOB provided consent for the FOB to be present.  MOB and FOB were easily engaged and were receptive to the visit.  MOB became more open about her depression and anxiety as visit progressed.  MOB presented with appropriate range in affect and presented in a pleasant mood.    CSW assisted MOB process her thoughts and feelings related to the transition to the postpartum period.  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  She shared that she is pursuing her MSW, but is attempting to balance going to school, participating in her internship, going to work, and raising her 28 year old.  As CSW validated her feelings, MOB continued to share awareness that she is supported by her family and her MSW program, but she continues to feel overwhelmed since she wants to complete her program as quickly as possible.  FOB also identified recent stressor of moving into a new home.  As FOB shared this stressor, MOB continued to process her feelings related to the physical discomfort she felt at the end of the   pregnancy.  MOB and FOB presented as being able to openly discuss these stressors, and were receptive to exploring how to reduce stress and anxiety related to these multiple stressors.  MOB shared that she has numerous friends with whom she can talk to, and she stated that the FOB is often helpful since he is able to help her sort through her feelings and identifying irrational/anxious thoughts.  MOB also stated that she enjoys taking bubble baths.  CSW encouraged MOB to engage in daily self-care and to engage in  activities everyday that cause her to feel happy.   MOB reported that she had been prescribed Zoloft prior to her pregnancy.  She stated that she was about to get a refill on the medication when she learned that she was pregnant. Per MOB, she did not want to take medication while she was pregnant, but that she has already talked to her MD about re-starting the medication as she transitions to the postpartum period. MOB presented with awareness of her increased risk of developing postpartum depression given her mental health history.   MOB stated that she does not have a current therapist, but confirmed that she has participated in therapy in the past.  She stated that she is not opposed to the idea, but discussed how attending therapy often increases stress since it is one more activity to fit into her schedule.  CSW validated her feelings, but explored the pros/cons of therapy with her.  MOB acknowledged that although it increases stress at times, feelings expression is helpful overall.  MOB is not opposed if symptoms warrant it, but she shared belief that she is well supported by her MSW friends who are able to provide her with "informal brief therapy".   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   No barriers to discharge.    VI SOCIAL WORK PLAN Social Work Plan  Patient/Family Education  No Further Intervention Required / No Barriers to Discharge  CSW recommended that MOB speak to MD prior to postpartum appointment to discuss re-starting Zoloft. CSW also recommended that MOB engage in therapy if she warrants increase in symptoms that are not alleviated by her social support.  Type of pt/family education:   Postpartum depression and anxiety   If child protective services report - county:   If child protective services report - date:   Information/referral to community  resources comment:   MOB declined need for referrals since she is aware of where to access resources if needed.   Other social work plan:   CSW to provide ongoing emotional support PRN.     

## 2014-01-24 NOTE — Progress Notes (Signed)
Patient ID: Sydney LexRenee L Walters, female   DOB: [birth year], 28 y.o.   MRN: [id number] Post Partum Day 1 S/P spontaneous vaginal RH status/Rubella reviewed.  Feeding: breast Subjective: No HA, SOB, CP, F/C, breast symptoms. Normal vaginal bleeding, no clots.     Objective: BP 125/78  Pulse 79  Temp(Src) 97.9 F (36.6 C) (Oral)  Resp 18  Ht 5\' 5"  (1.651 m)  Wt 92.534 kg (204 lb)  BMI 33.95 kg/m2  SpO2 98%  LMP 04/28/2013  Breastfeeding? Unknown   Physical Exam:  General: alert Lochia: appropriate Uterine Fundus: firm DVT Evaluation: No evidence of DVT seen on physical exam. Ext: No c/c/e  Recent Labs  01/23/14 0500 01/24/14 0555  HGB 12.8 12.2  HCT 37.6 37.4      Assessment/Plan: 28 y.o.  PPD #1 .  normal postpartum exam Continue current postpartum care Ambulate   LOS: 1 day   JACKSON-MOORE,Robert Sperl A 01/24/2014, 8:41 AM

## 2014-01-24 NOTE — Lactation Note (Signed)
This note was copied from the chart of Girl Novella RobRenee Dehn. Lactation Consultation Note  Patient Name: Girl Novella RobRenee Nicklin ZOXWR'UToday's Date: 01/24/2014 Reason for consult: Follow-up assessment Mom reports some mild nipple tenderness, no breakdown noted. Some nipple compression when baby comes off the breast. Assisted Mom with positioning and obtaining more depth with latch. Care for sore nipples reviewed, comfort gels given with instructions. Demonstrated how to bring bottom lip down with latch. Reviewed engorgement care with Mom if needed. Encouraged to call for questions/concerns or if she needs assist with latch.   Maternal Data    Feeding Feeding Type: Breast Fed Length of feed: 10 min  LATCH Score/Interventions Latch: Grasps breast easily, tongue down, lips flanged, rhythmical sucking. Intervention(s): Adjust position;Assist with latch;Breast massage;Breast compression  Audible Swallowing: A few with stimulation  Type of Nipple: Everted at rest and after stimulation  Comfort (Breast/Nipple): Filling, red/small blisters or bruises, mild/mod discomfort  Problem noted: Mild/Moderate discomfort Interventions (Mild/moderate discomfort): Hand massage;Hand expression;Comfort gels  Hold (Positioning): Assistance needed to correctly position infant at breast and maintain latch. Intervention(s): Breastfeeding basics reviewed;Support Pillows;Position options;Skin to skin  LATCH Score: 7  Lactation Tools Discussed/Used     Consult Status Consult Status: Follow-up Date: 01/25/14 Follow-up type: In-patient    Alfred LevinsGranger, Lunell Robart Ann 01/24/2014, 5:25 PM

## 2014-01-25 MED ORDER — IBUPROFEN 600 MG PO TABS: 600.0000 mg | ORAL_TABLET | Freq: Four times a day (QID) | ORAL | Status: AC | PRN

## 2014-01-25 MED ORDER — OXYCODONE-ACETAMINOPHEN 5-325 MG PO TABS: 1.0000 | ORAL_TABLET | ORAL | Status: DC | PRN

## 2014-01-25 MED ORDER — MEASLES, MUMPS & RUBELLA VAC ~~LOC~~ INJ
0.5000 mL | INJECTION | Freq: Once | SUBCUTANEOUS | Status: AC
  Administered 2014-01-25: 0.5 mL via SUBCUTANEOUS
  Filled 2014-01-25: qty 0.5

## 2014-01-25 NOTE — Lactation Note (Addendum)
This note was copied from the chart of Girl Novella RobRenee Anzaldo. Lactation Consultation Note  Patient Name: Girl Novella RobRenee Vecchione JXBJY'NToday's Date: 01/25/2014 Reason for consult: Follow-up assessment Baby 53 hours of life. Mom reports BF going well. Mom an experienced BF. Baby nursing well, suckling rhythmically with lips flanged and few swallows noted. Mom denies nipple pain. Enc to maintain a deep latch. Reviewed engorgement prevention/treatment. Discussed returning to work and pumping, and where to rent/purchase pumps. Enc hand expression while pumping, given a hand pump. Has an appointment with WIC. Referred to Baby and Me booklet for number of diapers to expect and EBM storage guidelines. Aware of OP/BFSG and LC phone line assistance.  Maternal Data    Feeding Feeding Type:  [facility]  LATCH Score/Interventions Latch: Grasps breast easily, tongue down, lips flanged, rhythmical sucking.  Audible Swallowing: A few with stimulation  Type of Nipple: Everted at rest and after stimulation  Comfort (Breast/Nipple): Soft / non-tender     Hold (Positioning): No assistance needed to correctly position infant at breast.  LATCH Score: 9  Lactation Tools Discussed/Used     Consult Status Consult Status: Complete    Geralynn OchsWILLIARD, Arnitra Sokoloski 01/25/2014, 10:39 AM

## 2014-01-25 NOTE — Discharge Instructions (Signed)
Before Baby Comes Home °Ask any questions about feeding, diapering, and baby care before you leave the hospital. Ask again if you do not understand. Ask when you need to see the doctor again. °There are several things you must have before your baby comes home. °· Infant car seat. °· Crib. °¨ Do not let your baby sleep in a bed with you or anyone else. °¨ If you do not have a bed for your baby, ask the doctor what you can use that will be safe for the baby to sleep in. °Infant feeding supplies: °· 6 to 8 bottles (8 ounce size). °· 6 to 8 nipples. °· Measuring cup. °· Measuring tablespoon. °· Bottle brush. °· Sterilizer (or use any large pan or kettle with a lid). °· Formula that contains iron. °· A way to boil and cool water. °Breastfeeding supplies: °· Breast pump. °· Nipple cream. °Clothing: °· 24 to 36 cloth diapers and waterproof diaper covers or a box of disposable diapers. You may need as many as 10 to 12 diapers per day. °· 3 onesies (other clothing will depend on the time of year and the weather). °· 3 receiving blankets. °· 3 baby pajamas or gowns. °· 3 bibs. °Bath equipment: °· Mild soap. °· Petroleum jelly. No baby oil or powder. °· Soft cloth towel and washcloth. °· Cotton balls. °· Separate bath basin for baby. Only sponge bathe until umbilical cord and circumcision are healed. °Other supplies: °· Thermometer and bulb syringe (ask the hospital to send them home with you). Ask your doctor about how you should take your baby's temperature. °· One to two pacifiers. °Prepare for an emergency: °· Know how to get to the hospital and know where to admit your baby. °· Put all doctor numbers near your house phone and in your cell phone if you have one. °Prepare your family: °· Talk with siblings about the baby coming home and how they feel about it. °· Decide how you want to handle visitors and other family members. °· Take offers for help with the baby. You will need time to adjust. °Know when to call the  doctor.  °GET HELP RIGHT AWAY IF: °· Your baby's temperature is greater than 100.4°F (38°C). °· The soft spot on your baby's head starts to bulge. °· Your baby is crying with no tears or has no wet diapers for 6 hours. °· Your baby has rapid breathing. °· Your baby is not as alert. °Document Released: 03/25/2008 Document Revised: 08/27/2013 Document Reviewed: 07/02/2010 °Exit[employer]® Patient Information ©2015 [employer]. This information is not intended to replace advice given to you by your health care provider. Make sure you discuss any questions you have with your health care provider. ° °

## 2014-01-25 NOTE — Discharge Summary (Signed)
Obstetric Discharge Summary Reason for Admission: onset of labor Prenatal Procedures: ultrasound Intrapartum Procedures: spontaneous vaginal delivery Postpartum Procedures: none Complications-Operative and Postpartum: none Hemoglobin  Date Value Ref Range Status  01/24/2014 12.2  12.0 - 15.0 g/dL Final     HCT  Date Value Ref Range Status  01/24/2014 37.4  36.0 - 46.0 % Final    Physical Exam:  General: alert and no distress Lochia: appropriate Uterine Fundus: firm Incision: none DVT Evaluation: No evidence of DVT seen on physical exam.  Discharge Diagnoses: Term Pregnancy-delivered  Discharge Information: Date: 01/25/2014 Activity: pelvic rest Diet: routine Medications: PNV, Ibuprofen, Colace and Percocet Condition: stable Instructions: refer to practice specific booklet Discharge to: home Follow-up Information   Follow up with Tyeshia Cornforth A, MD. Schedule an appointment as soon as possible for a visit in 2 weeks.   Specialty:  Obstetrics and Gynecology   Contact information:   [address] [place] [id number] [phone]       Newborn Data: Live born female  Birth Weight: 8 lb 4.3 oz (3750 g) APGAR: 5, 9  Home with mother.  Lynetta Tomczak A 01/25/2014, 8:49 AM

## 2014-01-25 NOTE — Progress Notes (Signed)
Post Partum Day 2 Subjective: no complaints  Objective: Blood pressure 118/86, pulse 82, temperature 98.3 F (36.8 C), temperature source Oral, resp. rate 18, height 5\' 5"  (1.651 m), weight 204 lb (92.534 kg), last menstrual period 04/28/2013, SpO2 98.00%, unknown if currently breastfeeding.  Physical Exam:  General: alert and no distress Lochia: appropriate Uterine Fundus: firm Incision: none DVT Evaluation: No evidence of DVT seen on physical exam.   Recent Labs  01/23/14 0500 01/24/14 0555  HGB 12.8 12.2  HCT 37.6 37.4    Assessment/Plan: Discharge home   LOS: 2 days   HARPER,CHARLES A 01/25/2014, 8:44 AM

## 2014-01-30 ENCOUNTER — Encounter: Payer: Medicaid Other | Admitting: Obstetrics & Gynecology

## 2014-02-07 ENCOUNTER — Encounter: Payer: Self-pay | Admitting: Obstetrics

## 2014-02-07 ENCOUNTER — Ambulatory Visit (INDEPENDENT_AMBULATORY_CARE_PROVIDER_SITE_OTHER): Payer: Medicaid Other | Admitting: Obstetrics

## 2014-02-07 VITALS — BP 113/81 | HR 92 | Temp 97.9°F | Ht 65.0 in | Wt 182.0 lb

## 2014-02-07 DIAGNOSIS — F53 Postpartum depression: Principal | ICD-10-CM

## 2014-02-07 DIAGNOSIS — O99345 Other mental disorders complicating the puerperium: Secondary | ICD-10-CM

## 2014-02-07 DIAGNOSIS — Z30011 Encounter for initial prescription of contraceptive pills: Secondary | ICD-10-CM

## 2014-02-07 MED ORDER — NORETHINDRONE 0.35 MG PO TABS
1.0000 | ORAL_TABLET | Freq: Every day | ORAL | Status: DC
Start: 2014-02-07 — End: 2015-01-13

## 2014-02-07 MED ORDER — SERTRALINE HCL 25 MG PO TABS: 25.0000 mg | ORAL_TABLET | Freq: Every day | ORAL | Status: DC

## 2014-02-07 NOTE — Addendum Note (Signed)
Addended by: Coral CeoHARPER, CHARLES A on: 02/07/2014 04:31 PM   Modules accepted: Orders

## 2014-02-07 NOTE — Progress Notes (Signed)
Subjective:     Sydney Walters is a 28 y.o. female who presents for a postpartum visit. She is 2 weeks postpartum following a spontaneous vaginal delivery. I have fully reviewed the prenatal and intrapartum course. The delivery was at 38 gestational weeks. Outcome: spontaneous vaginal delivery. Anesthesia: epidural. Postpartum course has been abnormal with depression. Baby's course has been normal. Baby is feeding by breast. Bleeding thin lochia. Bowel function is normal. Bladder function is normal. Patient is not sexually active. Contraception method is abstinence. Postpartum depression screening: negative.  Tobacco, alcohol and substance abuse history reviewed.  Adult immunizations reviewed including TDAP, rubella and varicella.  The following portions of the patient's history were reviewed and updated as appropriate: allergies, current medications, past family history, past medical history, past social history, past surgical history and problem list.  Review of Systems A comprehensive review of systems was negative.   Objective:    BP 113/81  Pulse 92  Temp(Src) 97.9 F (36.6 C)  Ht 5\' 5"  (1.651 m)  Wt 182 lb (82.555 kg)  BMI 30.29 kg/m2  Breastfeeding? Yes   PE:  Deferred    Assessment:    Postpartum.  2 weeks.  Depression.  Plan:    1. Contraception: oral progesterone-only contraceptive 2. Micronor Rx. 3. Follow up in: 4 weeks or as needed.   Healthy lifestyle practices reviewed

## 2014-02-12 ENCOUNTER — Telehealth: Payer: Self-pay

## 2014-02-12 NOTE — Telephone Encounter (Signed)
patient will come by and pick up disability paperwork - faxed on 02/12/14

## 2014-02-25 ENCOUNTER — Encounter: Payer: Self-pay | Admitting: Obstetrics

## 2014-03-07 ENCOUNTER — Encounter: Payer: Self-pay | Admitting: Obstetrics

## 2014-03-07 ENCOUNTER — Ambulatory Visit (INDEPENDENT_AMBULATORY_CARE_PROVIDER_SITE_OTHER): Payer: Medicaid Other | Admitting: Obstetrics

## 2014-03-07 DIAGNOSIS — Z3041 Encounter for surveillance of contraceptive pills: Secondary | ICD-10-CM

## 2014-03-07 NOTE — Progress Notes (Signed)
Subjective:     Sydney Walters is a 28 y.o. female who presents for a postpartum visit. She is 6 weeks postpartum following a spontaneous vaginal delivery. I have fully reviewed the prenatal and intrapartum course. The delivery was at 38 gestational weeks. Outcome: spontaneous vaginal delivery. Anesthesia: none. Postpartum course has been normal. Baby's course has been normal. Baby is feeding by breast. Bleeding thin lochia. Bowel function is normal. Bladder function is normal. Patient is not sexually active. Contraception method is abstinence. Postpartum depression screening: negative.  Tobacco, alcohol and substance abuse history reviewed.  Adult immunizations reviewed including TDAP, rubella and varicella.  The following portions of the patient's history were reviewed and updated as appropriate: allergies, current medications, past family history, past medical history, past social history, past surgical history and problem list.  Review of Systems A comprehensive review of systems was negative.   Objective:    BP 117/82 mmHg  Pulse 96  Temp(Src) 97.6 F (36.4 C)  Ht 5\' 5"  (1.651 m)  Wt 178 lb (80.74 kg)  BMI 29.62 kg/m2  Breastfeeding? Yes  General:  alert and no distress   Breasts:  inspection negative, no nipple discharge or bleeding, no masses or nodularity palpable  Lungs: clear to auscultation bilaterally  Heart:  regular rate and rhythm, S1, S2 normal, no murmur, click, rub or gallop  Abdomen: normal findings: soft, non-tender   Vulva:  normal  Vagina: normal vagina  Cervix:  no cervical motion tenderness  Corpus: normal size, contour, position, consistency, mobility, non-tender and firm  Adnexa:  no mass, fullness, tenderness  Rectal Exam: Not performed.          Assessment:     Normal postpartum exam. Pap smear not done at today's visit.  Plan:    1. Contraception: Micronor 2. Continue PNV's 3. Follow up in: several months or as needed.   Healthy lifestyle  practices reviewed

## 2014-04-01 ENCOUNTER — Ambulatory Visit: Payer: Medicaid Other | Admitting: Family Medicine

## 2014-04-22 ENCOUNTER — Encounter: Payer: Self-pay | Admitting: *Deleted

## 2014-04-23 ENCOUNTER — Encounter: Payer: Self-pay | Admitting: Obstetrics & Gynecology

## 2014-05-29 ENCOUNTER — Telehealth: Payer: Self-pay | Admitting: *Deleted

## 2014-05-29 DIAGNOSIS — O9102 Infection of nipple associated with the puerperium: Principal | ICD-10-CM

## 2014-05-29 DIAGNOSIS — B3789 Other sites of candidiasis: Secondary | ICD-10-CM

## 2014-05-29 MED ORDER — FLUCONAZOLE 150 MG PO TABS: 150.0000 mg | ORAL_TABLET | ORAL | Status: AC

## 2014-05-29 MED ORDER — NYSTATIN 100000 UNIT/GM EX CREA: 1.0000 "application " | TOPICAL_CREAM | Freq: Two times a day (BID) | CUTANEOUS | Status: AC

## 2014-05-29 NOTE — Telephone Encounter (Signed)
Patient states she is breast feeding and her baby has thrush.  3:40 Called patient she is having slight symptoms of yeast and states the pediatrician advised her to call. Per midwife- may call in Diflucan  and nystatin cream. Advised patient to clean anything baby puts in mouth and if husband has been in that area- he should be treated also.

## 2014-10-30 ENCOUNTER — Ambulatory Visit: Payer: Medicaid Other | Admitting: Obstetrics

## 2015-01-13 ENCOUNTER — Telehealth: Payer: Self-pay | Admitting: *Deleted

## 2015-01-13 DIAGNOSIS — Z30011 Encounter for initial prescription of contraceptive pills: Secondary | ICD-10-CM

## 2015-01-13 MED ORDER — NORETHINDRONE 0.35 MG PO TABS
1.0000 | ORAL_TABLET | Freq: Every day | ORAL | Status: DC
Start: 2015-01-13 — End: 2015-01-13

## 2015-01-13 MED ORDER — NORETHINDRONE 0.35 MG PO TABS: 1.0000 | ORAL_TABLET | Freq: Every day | ORAL | Status: AC

## 2015-01-13 NOTE — Telephone Encounter (Signed)
Patient states her refill on her POP is being denied- she isn't in the system. ( It is her name) 2:36 Call to patient she has moved to [place] and she has changed her name to her married name- reminded her if she has a history of abnormal she needs to get annual paps and she will call for an appointment. Rx refilled for 6 months.

## 2015-02-13 ENCOUNTER — Other Ambulatory Visit: Payer: Self-pay | Admitting: *Deleted

## 2015-02-13 NOTE — Telephone Encounter (Signed)
Patient states she is having a problem getting her Zoloft refilled and she thinks it may be because if her name change. Can she please get refills on that?

## 2015-02-14 ENCOUNTER — Other Ambulatory Visit: Payer: Self-pay | Admitting: Obstetrics

## 2015-02-14 DIAGNOSIS — Z30011 Encounter for initial prescription of contraceptive pills: Secondary | ICD-10-CM

## 2015-02-14 MED ORDER — SERTRALINE HCL 25 MG PO TABS: 25.0000 mg | ORAL_TABLET | Freq: Every day | ORAL | Status: AC

## 2015-02-14 NOTE — Telephone Encounter (Signed)
Zoloft Rx

## 2015-02-17 NOTE — Telephone Encounter (Signed)
Attempt to contact pt.  LM on VM making pt aware that Dr Clearance CootsHarper has sent Rx request in to pharmacy.  Advised pt to call office if any problems in getting Rx.

## 2016-07-12 IMAGING — US US FETAL BPP W/O NONSTRESS
1 series · 13 of 28 positions shown · non-contrast
Comparison: none

[Series 1: us fetal bpp w/o nonstress · 0.31mm/px · 29 acquisitions, 13 frames shown]
[im 2/29]
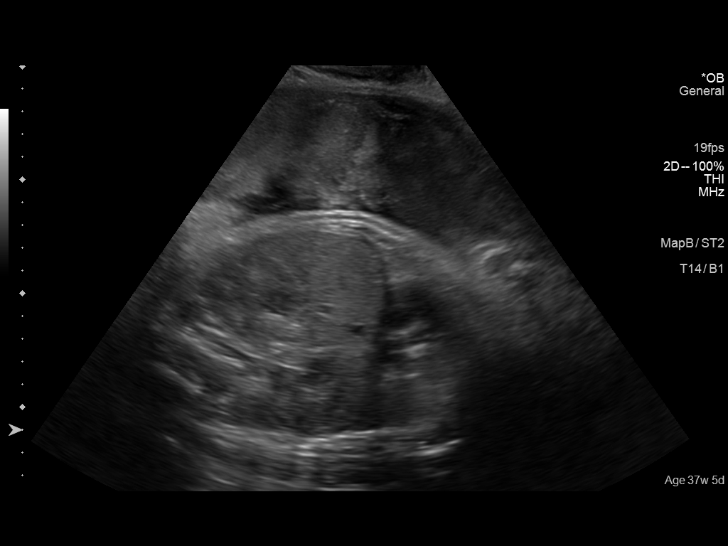
[im 4/29]
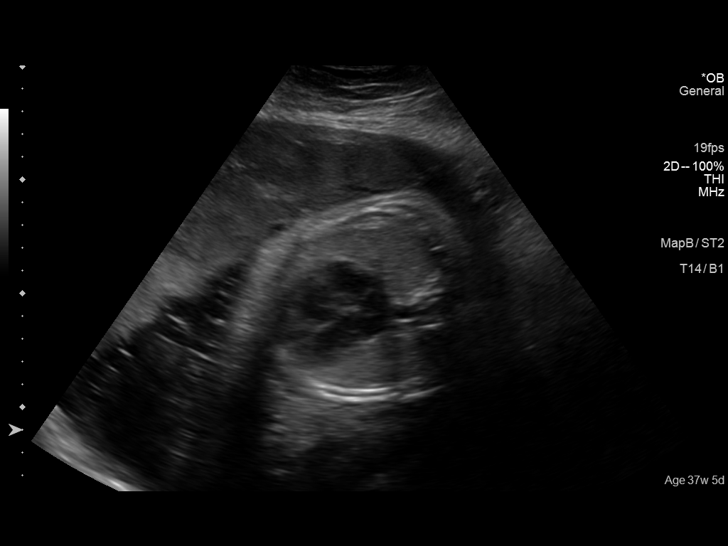
[im 6/29]
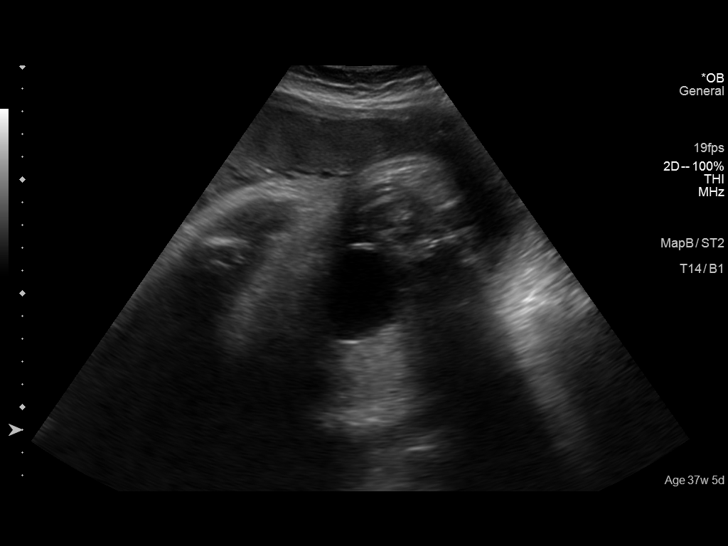
[im 8/29]
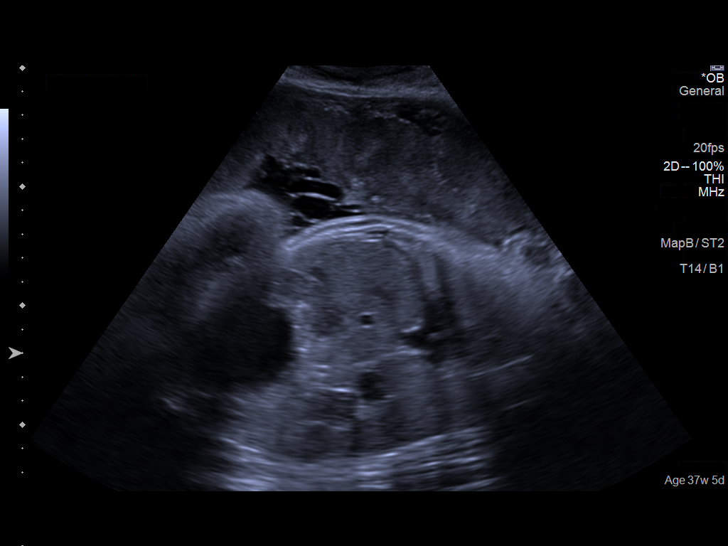
[im 10/29]
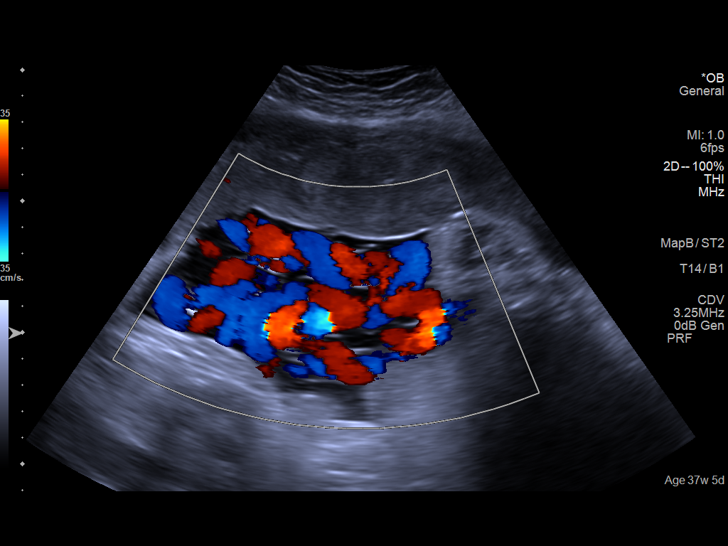
[im 12/29]
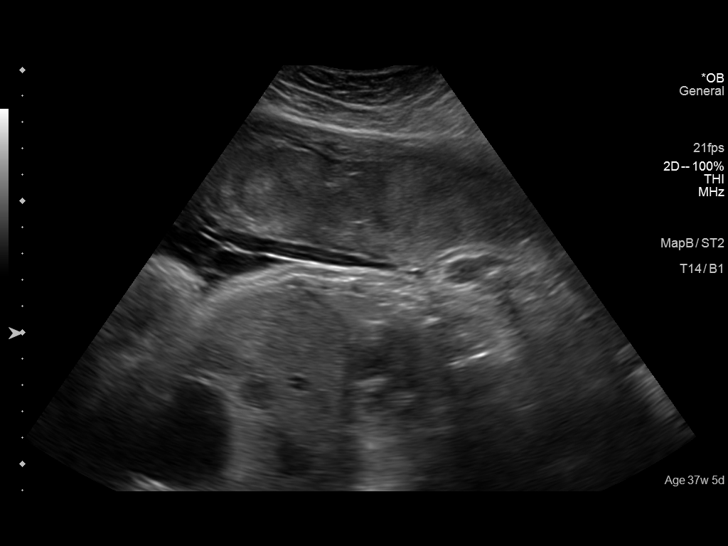
[im 15/29]
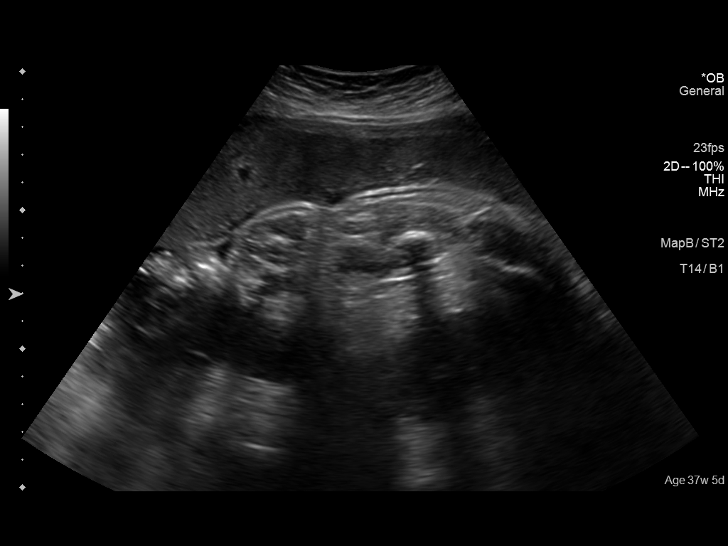
[im 17/29]
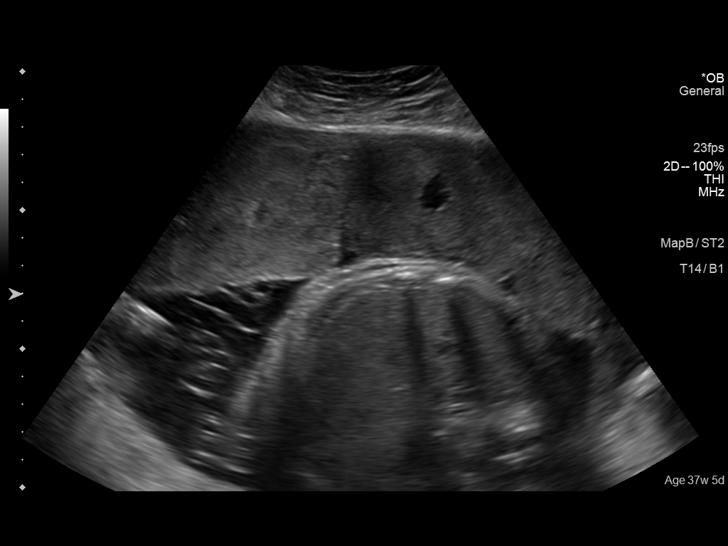
[im 19/29]
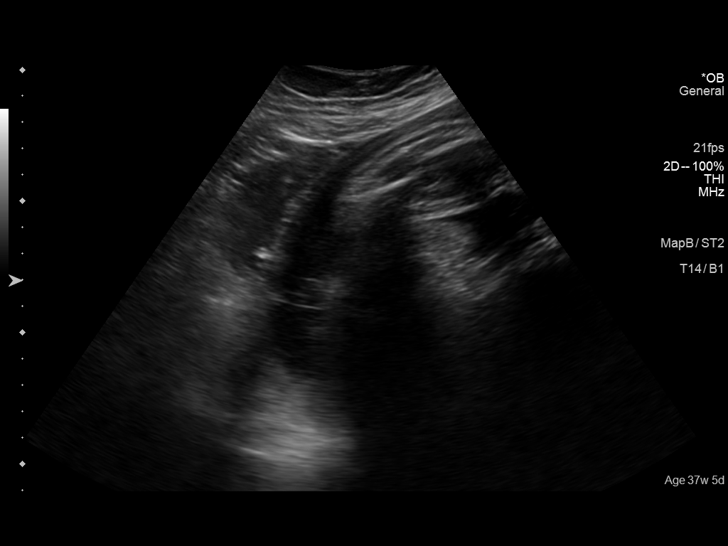
[im 21/29]
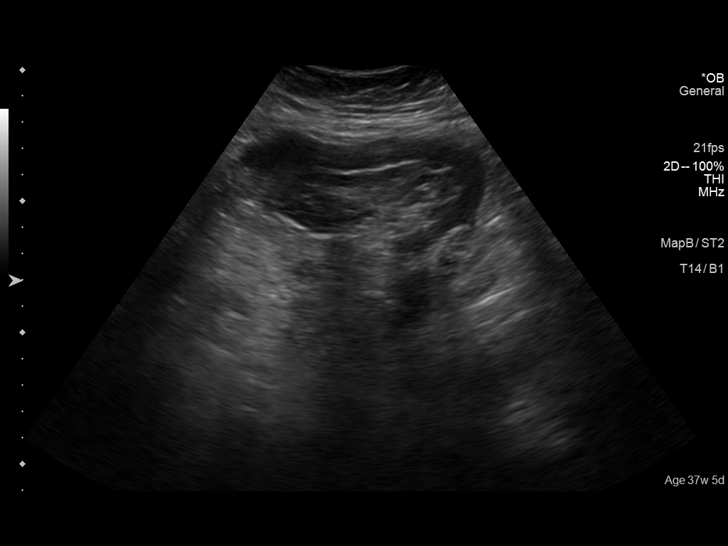
[im 23/29]
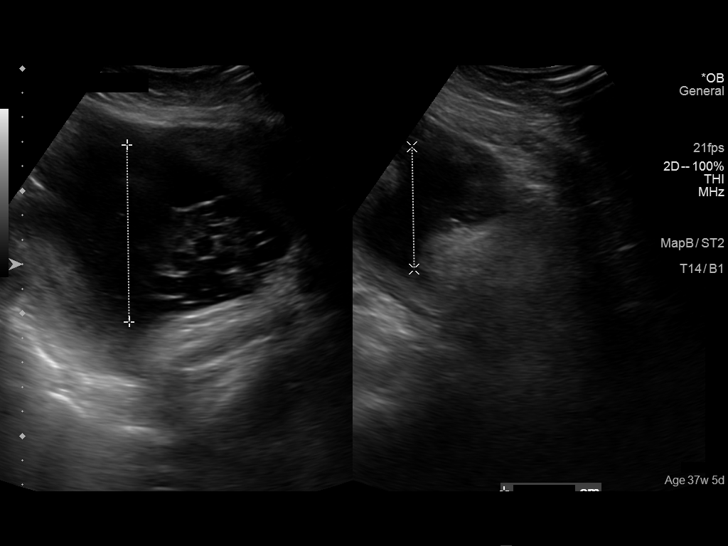
[im 25/29]
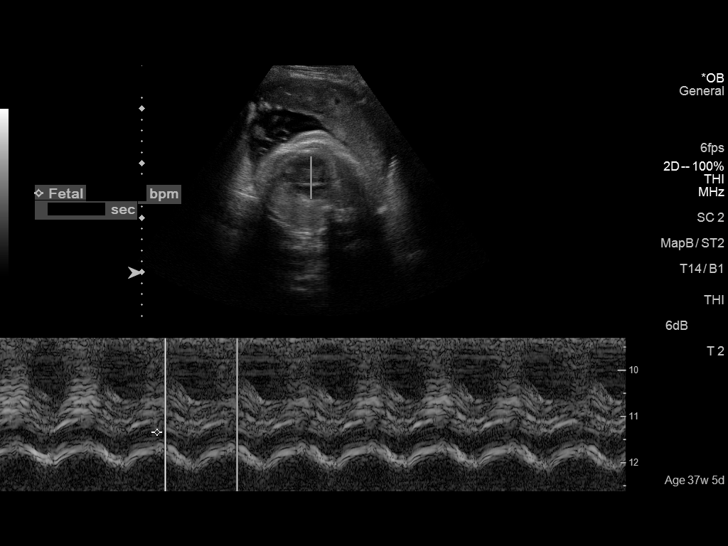
[im 27/29]
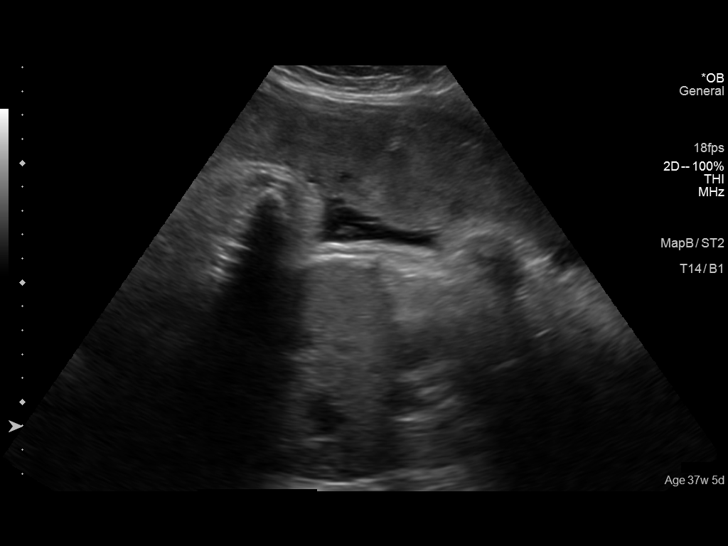

[13 of 28 positions shown; findings below may reference images not displayed]

OBSTETRICS REPORT
                      (Signed Final 01/17/2014 [DATE])

Service(s) Provided

Indications

 Non-reactive NST, FHR decelerations
 Hypertension - Gestational
 Previous pregnacy with congenital heart (cardiac)
 defect
Fetal Evaluation

 Num Of Fetuses:    1
 Fetal Heart Rate:  143                          bpm
 Cardiac Activity:  Observed
 Presentation:      Cephalic
 Placenta:          Anterior, above cervical os
 P. Cord            Previously Visualized
 Insertion:

 Amniotic Fluid
 AFI FV:      Subjectively within normal limits
 AFI Sum:     14.73   cm       56  %Tile     Larg Pckt:    7.22  cm
 RUQ:   7.22    cm   RLQ:    2.52   cm    LUQ:   4.99    cm
Biophysical Evaluation

 Amniotic F.V:   Within normal limits       F. Tone:        Observed
 F. Movement:    Observed                   Score:          [DATE]
 F. Breathing:   Not Observed
Gestational Age

 LMP:           37w 5d        Date:  04/28/13                 EDD:   02/02/14
 Best:          37w 5d     Det. By:  LMP  (04/28/13)          EDD:   02/02/14
Cervix Uterus Adnexa

 Cervix:       Not visualized (advanced GA >73wks)
 Uterus:       No abnormality visualized.
 Cul De Sac:   No free fluid seen.
 Left Ovary:    No adnexal mass visualized.
 Right Ovary:   No adnexal mass visualized.
 Adnexa:     No adnexal mass visualized.
Impression

 Single IUP at 37w 5d
 BPP [DATE] (-2 for absent breathing movement)
 Normal amniotic fluid volume
Recommendations

 Please correlate BPP with NST - if non reactive, would
 recommend delivery given current gestational age.
 If the patient meets criteria for gestational hypertension (SBP
 would also recommend delivery.

 questions or concerns.
